# Patient Record
Sex: Female | Born: 1995 | Race: White | Hispanic: No | Marital: Married | Smoking: Never smoker
Health system: Southern US, Community
[De-identification: ages and names within clinical notes are randomized; demographics above are authoritative.]

## PROBLEM LIST (undated history)

## (undated) DIAGNOSIS — Z8669 Personal history of other diseases of the nervous system and sense organs: Secondary | ICD-10-CM

## (undated) DIAGNOSIS — Z789 Other specified health status: Secondary | ICD-10-CM

## (undated) DIAGNOSIS — R002 Palpitations: Secondary | ICD-10-CM

## (undated) DIAGNOSIS — L509 Urticaria, unspecified: Secondary | ICD-10-CM

## (undated) HISTORY — PX: APPENDECTOMY: SHX54

## (undated) HISTORY — DX: Palpitations: R00.2

## (undated) HISTORY — DX: Urticaria, unspecified: L50.9

## (undated) HISTORY — DX: Personal history of other diseases of the nervous system and sense organs: Z86.69

---
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
## 2016-09-15 DIAGNOSIS — L239 Allergic contact dermatitis, unspecified cause: Secondary | ICD-10-CM | POA: Insufficient documentation

## 2017-05-19 NOTE — L&D Delivery Note (Signed)
Delivery Note At 2:51 PM a viable and healthy female was delivered via Vaginal, Spontaneous (Presentation: left occput; anterior ).  APGAR: 9, 9; weight pending .   Placenta status: spontaneous, intact.  Cord: 3V   With delivery of the placenta uterine atony was noted. This resolved with bimanual massage and IM methergine. qBL estimated EBL to be 941, clinical estimate is 500cc  Anesthesia:  Epidural Episiotomy: None Lacerations: 2nd degree Suture Repair: 3.0 vicryl Est. Blood Loss (mL):  941 cc  Mom to postpartum.  Baby to Couplet care / Skin to Skin.  Waynard Reeds 03/18/2018, 3:28 PM

## 2017-07-09 DIAGNOSIS — R300 Dysuria: Secondary | ICD-10-CM | POA: Insufficient documentation

## 2017-07-20 DIAGNOSIS — J029 Acute pharyngitis, unspecified: Secondary | ICD-10-CM | POA: Insufficient documentation

## 2017-10-21 ENCOUNTER — Other Ambulatory Visit (HOSPITAL_COMMUNITY): Payer: Self-pay | Admitting: Obstetrics and Gynecology

## 2017-10-21 DIAGNOSIS — O358XX0 Maternal care for other (suspected) fetal abnormality and damage, not applicable or unspecified: Secondary | ICD-10-CM

## 2017-10-21 DIAGNOSIS — Z3A20 20 weeks gestation of pregnancy: Secondary | ICD-10-CM

## 2017-10-21 DIAGNOSIS — Z3689 Encounter for other specified antenatal screening: Secondary | ICD-10-CM

## 2017-10-21 DIAGNOSIS — O35EXX0 Maternal care for other (suspected) fetal abnormality and damage, fetal genitourinary anomalies, not applicable or unspecified: Secondary | ICD-10-CM

## 2017-11-04 ENCOUNTER — Encounter (HOSPITAL_COMMUNITY): Payer: Self-pay | Admitting: *Deleted

## 2017-11-05 ENCOUNTER — Encounter (HOSPITAL_COMMUNITY): Payer: Self-pay

## 2017-11-05 ENCOUNTER — Ambulatory Visit (HOSPITAL_COMMUNITY)
Admission: RE | Admit: 2017-11-05 | Discharge: 2017-11-05 | Disposition: A | Discharge status: Home / Self Care | Payer: Commercial Managed Care - PPO | Source: Ambulatory Visit | Attending: Obstetrics and Gynecology | Admitting: Obstetrics and Gynecology

## 2017-11-05 ENCOUNTER — Other Ambulatory Visit: Payer: Self-pay

## 2017-11-05 DIAGNOSIS — Z363 Encounter for antenatal screening for malformations: Secondary | ICD-10-CM | POA: Insufficient documentation

## 2017-11-05 DIAGNOSIS — O359XX0 Maternal care for (suspected) fetal abnormality and damage, unspecified, not applicable or unspecified: Secondary | ICD-10-CM | POA: Insufficient documentation

## 2017-11-05 DIAGNOSIS — Z3A21 21 weeks gestation of pregnancy: Secondary | ICD-10-CM | POA: Insufficient documentation

## 2017-11-05 DIAGNOSIS — O99212 Obesity complicating pregnancy, second trimester: Secondary | ICD-10-CM | POA: Insufficient documentation

## 2017-11-05 DIAGNOSIS — O35EXX0 Maternal care for other (suspected) fetal abnormality and damage, fetal genitourinary anomalies, not applicable or unspecified: Secondary | ICD-10-CM

## 2017-11-05 DIAGNOSIS — Z3A20 20 weeks gestation of pregnancy: Secondary | ICD-10-CM

## 2017-11-05 DIAGNOSIS — O358XX0 Maternal care for other (suspected) fetal abnormality and damage, not applicable or unspecified: Secondary | ICD-10-CM

## 2017-11-05 DIAGNOSIS — Z3689 Encounter for other specified antenatal screening: Secondary | ICD-10-CM

## 2017-11-05 HISTORY — DX: Other specified health status: Z78.9

## 2017-11-12 ENCOUNTER — Telehealth (HOSPITAL_COMMUNITY): Payer: Self-pay | Admitting: MS"

## 2017-11-12 NOTE — Telephone Encounter (Signed)
Called Becky Crawford to discuss her prenatal cell free DNA test results.  Mrs. Becky Crawford had Pan[facility] testing through [facility].  Testing was offered because of ultrasound findings.   The patient was identified by name and DOB.  We reviewed that these are within normal limits, showing a less than 1 in 10,000 risk for trisomies 21, 18 and 13, and monosomy X (Turner syndrome).  In addition, the risk for triploidy and sex chromosome trisomies (47,XXX and 47,XXY) was also low risk. We reviewed that this testing identifies > 99% of pregnancies with trisomy 8221, trisomy 6013, sex chromosome trisomies (47,XXX and 47,XXY), and triploidy. The detection rate for trisomy 18 is 96%.  The detection rate for monosomy X is ~92%.  The false positive rate is <0.1% for all conditions. Testing was also consistent with female fetal sex.  She understands that this testing does not identify all genetic conditions.  All questions were answered to her satisfaction, she was encouraged to call with additional questions or concerns.  Quinn PlowmanKaren Denvil Canning, MS Patent attorneyCertified Genetic Counselor

## 2018-01-13 DIAGNOSIS — K529 Noninfective gastroenteritis and colitis, unspecified: Secondary | ICD-10-CM | POA: Insufficient documentation

## 2018-03-17 ENCOUNTER — Encounter (HOSPITAL_COMMUNITY): Payer: Self-pay

## 2018-03-17 ENCOUNTER — Inpatient Hospital Stay (HOSPITAL_COMMUNITY)
Admission: AD | Admit: 2018-03-17 | Discharge: 2018-03-20 | DRG: 807 | Disposition: A | Discharge status: Home / Self Care | Payer: Commercial Managed Care - PPO | Attending: Obstetrics and Gynecology | Admitting: Obstetrics and Gynecology

## 2018-03-17 DIAGNOSIS — Z3A40 40 weeks gestation of pregnancy: Secondary | ICD-10-CM

## 2018-03-17 DIAGNOSIS — O358XX0 Maternal care for other (suspected) fetal abnormality and damage, not applicable or unspecified: Secondary | ICD-10-CM | POA: Diagnosis present

## 2018-03-17 DIAGNOSIS — O134 Gestational [pregnancy-induced] hypertension without significant proteinuria, complicating childbirth: Principal | ICD-10-CM | POA: Diagnosis present

## 2018-03-17 DIAGNOSIS — O479 False labor, unspecified: Secondary | ICD-10-CM | POA: Diagnosis present

## 2018-03-17 NOTE — MAU Note (Signed)
CTX every 5-6 minutes.  No LOF.  Some VB since having her membranes stripped in the office.  Was 2 cm today.  + FM.

## 2018-03-18 ENCOUNTER — Inpatient Hospital Stay (HOSPITAL_COMMUNITY): Payer: Commercial Managed Care - PPO | Admitting: Anesthesiology

## 2018-03-18 ENCOUNTER — Encounter (HOSPITAL_COMMUNITY): Payer: Self-pay

## 2018-03-18 DIAGNOSIS — R030 Elevated blood-pressure reading, without diagnosis of hypertension: Secondary | ICD-10-CM | POA: Diagnosis present

## 2018-03-18 DIAGNOSIS — O358XX0 Maternal care for other (suspected) fetal abnormality and damage, not applicable or unspecified: Secondary | ICD-10-CM | POA: Diagnosis present

## 2018-03-18 DIAGNOSIS — O133 Gestational [pregnancy-induced] hypertension without significant proteinuria, third trimester: Secondary | ICD-10-CM

## 2018-03-18 DIAGNOSIS — O479 False labor, unspecified: Secondary | ICD-10-CM | POA: Diagnosis present

## 2018-03-18 DIAGNOSIS — O134 Gestational [pregnancy-induced] hypertension without significant proteinuria, complicating childbirth: Secondary | ICD-10-CM | POA: Diagnosis present

## 2018-03-18 DIAGNOSIS — Z3A40 40 weeks gestation of pregnancy: Secondary | ICD-10-CM | POA: Diagnosis not present

## 2018-03-18 LAB — COMPREHENSIVE METABOLIC PANEL
ALK PHOS: 108 U/L (ref 38–126)
ALT: 14 U/L (ref 0–44)
AST: 20 U/L (ref 15–41)
Albumin: 3 g/dL — ABNORMAL LOW (ref 3.5–5.0)
Anion gap: 8 (ref 5–15)
BUN: 6 mg/dL (ref 6–20)
CALCIUM: 9 mg/dL (ref 8.9–10.3)
CO2: 21 mmol/L — AB (ref 22–32)
CREATININE: 0.48 mg/dL (ref 0.44–1.00)
Chloride: 106 mmol/L (ref 98–111)
Glucose, Bld: 82 mg/dL (ref 70–99)
Potassium: 3.6 mmol/L (ref 3.5–5.1)
Sodium: 135 mmol/L (ref 135–145)
Total Bilirubin: 0.9 mg/dL (ref 0.3–1.2)
Total Protein: 6.6 g/dL (ref 6.5–8.1)

## 2018-03-18 LAB — CBC
HCT: 37.3 % (ref 36.0–46.0)
HEMOGLOBIN: 12.4 g/dL (ref 12.0–15.0)
MCH: 28.3 pg (ref 26.0–34.0)
MCHC: 33.2 g/dL (ref 30.0–36.0)
MCV: 85.2 fL (ref 80.0–100.0)
Platelets: 249 10*3/uL (ref 150–400)
RBC: 4.38 MIL/uL (ref 3.87–5.11)
RDW: 14.8 % (ref 11.5–15.5)
WBC: 19.3 10*3/uL — ABNORMAL HIGH (ref 4.0–10.5)

## 2018-03-18 LAB — ABO/RH: ABO/RH(D): B POS

## 2018-03-18 LAB — TYPE AND SCREEN
ABO/RH(D): B POS
ANTIBODY SCREEN: NEGATIVE

## 2018-03-18 LAB — RPR: RPR Ser Ql: NONREACTIVE

## 2018-03-18 LAB — PROTEIN / CREATININE RATIO, URINE: CREATININE, URINE: 39 mg/dL

## 2018-03-18 MED ORDER — FENTANYL 2.5 MCG/ML BUPIVACAINE 1/10 % EPIDURAL INFUSION (WH - ANES)
14.0000 mL/h | INTRAMUSCULAR | Status: DC | PRN
  Administered 2018-03-18: 14 mL/h via EPIDURAL
  Filled 2018-03-18 (×3): qty 100

## 2018-03-18 MED ORDER — METHYLERGONOVINE MALEATE 0.2 MG/ML IJ SOLN
INTRAMUSCULAR | Status: AC
  Filled 2018-03-18: qty 1

## 2018-03-18 MED ORDER — OXYCODONE-ACETAMINOPHEN 5-325 MG PO TABS: 2.0000 | ORAL_TABLET | ORAL | Status: DC | PRN

## 2018-03-18 MED ORDER — PHENYLEPHRINE 40 MCG/ML (10ML) SYRINGE FOR IV PUSH (FOR BLOOD PRESSURE SUPPORT)
80.0000 ug | PREFILLED_SYRINGE | INTRAVENOUS | Status: DC | PRN
  Filled 2018-03-18: qty 5

## 2018-03-18 MED ORDER — LACTATED RINGERS IV SOLN: 500.0000 mL | Freq: Once | INTRAVENOUS | Status: DC

## 2018-03-18 MED ORDER — OXYCODONE-ACETAMINOPHEN 5-325 MG PO TABS
1.0000 | ORAL_TABLET | ORAL | Status: DC | PRN
  Administered 2018-03-18: 1 via ORAL
  Filled 2018-03-18: qty 1

## 2018-03-18 MED ORDER — ONDANSETRON HCL 4 MG/2ML IJ SOLN: 4.0000 mg | INTRAMUSCULAR | Status: DC | PRN

## 2018-03-18 MED ORDER — DIBUCAINE 1 % RE OINT: 1.0000 "application " | TOPICAL_OINTMENT | RECTAL | Status: DC | PRN

## 2018-03-18 MED ORDER — METHYLERGONOVINE MALEATE 0.2 MG/ML IJ SOLN
0.2000 mg | Freq: Once | INTRAMUSCULAR | Status: AC
  Administered 2018-03-18: 0.2 mg via INTRAMUSCULAR

## 2018-03-18 MED ORDER — IBUPROFEN 600 MG PO TABS
600.0000 mg | ORAL_TABLET | Freq: Four times a day (QID) | ORAL | Status: DC
  Administered 2018-03-18 – 2018-03-20 (×8): 600 mg via ORAL
  Filled 2018-03-18 (×8): qty 1

## 2018-03-18 MED ORDER — NALBUPHINE HCL 10 MG/ML IJ SOLN: 5.0000 mg | Freq: Once | INTRAMUSCULAR | Status: DC

## 2018-03-18 MED ORDER — OXYCODONE-ACETAMINOPHEN 5-325 MG PO TABS: 1.0000 | ORAL_TABLET | ORAL | Status: DC | PRN

## 2018-03-18 MED ORDER — PRENATAL MULTIVITAMIN CH
1.0000 | ORAL_TABLET | Freq: Every day | ORAL | Status: DC
  Administered 2018-03-19 – 2018-03-20 (×2): 1 via ORAL
  Filled 2018-03-18 (×2): qty 1

## 2018-03-18 MED ORDER — LIDOCAINE HCL (PF) 1 % IJ SOLN
INTRAMUSCULAR | Status: DC | PRN
  Administered 2018-03-18: 8 mL via EPIDURAL

## 2018-03-18 MED ORDER — MISOPROSTOL 200 MCG PO TABS
ORAL_TABLET | ORAL | Status: AC
  Filled 2018-03-18: qty 4

## 2018-03-18 MED ORDER — SIMETHICONE 80 MG PO CHEW: 80.0000 mg | CHEWABLE_TABLET | ORAL | Status: DC | PRN

## 2018-03-18 MED ORDER — BENZOCAINE-MENTHOL 20-0.5 % EX AERO
1.0000 "application " | INHALATION_SPRAY | CUTANEOUS | Status: DC | PRN
  Filled 2018-03-18: qty 56

## 2018-03-18 MED ORDER — OXYTOCIN 40 UNITS IN LACTATED RINGERS INFUSION - SIMPLE MED: 2.5000 [IU]/h | INTRAVENOUS | Status: DC

## 2018-03-18 MED ORDER — WITCH HAZEL-GLYCERIN EX PADS: 1.0000 "application " | MEDICATED_PAD | CUTANEOUS | Status: DC | PRN

## 2018-03-18 MED ORDER — DIPHENHYDRAMINE HCL 50 MG/ML IJ SOLN: 12.5000 mg | INTRAMUSCULAR | Status: DC | PRN

## 2018-03-18 MED ORDER — FENTANYL CITRATE (PF) 100 MCG/2ML IJ SOLN
50.0000 ug | INTRAMUSCULAR | Status: DC | PRN
  Administered 2018-03-18: 100 ug via INTRAVENOUS
  Administered 2018-03-18 (×3): 50 ug via INTRAVENOUS
  Filled 2018-03-18 (×4): qty 2

## 2018-03-18 MED ORDER — EPHEDRINE 5 MG/ML INJ
10.0000 mg | INTRAVENOUS | Status: DC | PRN
  Filled 2018-03-18: qty 2

## 2018-03-18 MED ORDER — ONDANSETRON HCL 4 MG PO TABS: 4.0000 mg | ORAL_TABLET | ORAL | Status: DC | PRN

## 2018-03-18 MED ORDER — TETANUS-DIPHTH-ACELL PERTUSSIS 5-2.5-18.5 LF-MCG/0.5 IM SUSP: 0.5000 mL | Freq: Once | INTRAMUSCULAR | Status: DC

## 2018-03-18 MED ORDER — OXYTOCIN BOLUS FROM INFUSION: 500.0000 mL | Freq: Once | INTRAVENOUS | Status: DC

## 2018-03-18 MED ORDER — ONDANSETRON HCL 4 MG/2ML IJ SOLN
4.0000 mg | Freq: Four times a day (QID) | INTRAMUSCULAR | Status: DC | PRN
  Administered 2018-03-18: 4 mg via INTRAVENOUS
  Filled 2018-03-18: qty 2

## 2018-03-18 MED ORDER — LIDOCAINE HCL (PF) 1 % IJ SOLN
30.0000 mL | INTRAMUSCULAR | Status: DC | PRN
  Filled 2018-03-18: qty 30

## 2018-03-18 MED ORDER — ACETAMINOPHEN 325 MG PO TABS
650.0000 mg | ORAL_TABLET | ORAL | Status: DC | PRN
  Administered 2018-03-18 – 2018-03-19 (×3): 650 mg via ORAL
  Filled 2018-03-18 (×3): qty 2

## 2018-03-18 MED ORDER — PHENYLEPHRINE 40 MCG/ML (10ML) SYRINGE FOR IV PUSH (FOR BLOOD PRESSURE SUPPORT)
80.0000 ug | PREFILLED_SYRINGE | INTRAVENOUS | Status: DC | PRN
  Filled 2018-03-18: qty 5
  Filled 2018-03-18: qty 10

## 2018-03-18 MED ORDER — COCONUT OIL OIL: 1.0000 "application " | TOPICAL_OIL | Status: DC | PRN

## 2018-03-18 MED ORDER — OXYTOCIN 40 UNITS IN LACTATED RINGERS INFUSION - SIMPLE MED
1.0000 m[IU]/min | INTRAVENOUS | Status: DC
  Administered 2018-03-18: 2 m[IU]/min via INTRAVENOUS
  Filled 2018-03-18: qty 1000

## 2018-03-18 MED ORDER — METHYLERGONOVINE MALEATE 0.2 MG PO TABS: 0.2000 mg | ORAL_TABLET | ORAL | Status: DC | PRN

## 2018-03-18 MED ORDER — METHYLERGONOVINE MALEATE 0.2 MG/ML IJ SOLN: 0.2000 mg | INTRAMUSCULAR | Status: DC | PRN

## 2018-03-18 MED ORDER — ACETAMINOPHEN 325 MG PO TABS
650.0000 mg | ORAL_TABLET | ORAL | Status: DC | PRN
  Administered 2018-03-18: 650 mg via ORAL
  Filled 2018-03-18: qty 2

## 2018-03-18 MED ORDER — LACTATED RINGERS IV SOLN: 500.0000 mL | INTRAVENOUS | Status: DC | PRN

## 2018-03-18 MED ORDER — DIPHENHYDRAMINE HCL 25 MG PO CAPS: 25.0000 mg | ORAL_CAPSULE | Freq: Four times a day (QID) | ORAL | Status: DC | PRN

## 2018-03-18 MED ORDER — LACTATED RINGERS IV SOLN
INTRAVENOUS | Status: DC
  Administered 2018-03-18: 03:00:00 via INTRAVENOUS

## 2018-03-18 MED ORDER — TERBUTALINE SULFATE 1 MG/ML IJ SOLN
0.2500 mg | Freq: Once | INTRAMUSCULAR | Status: DC | PRN
  Filled 2018-03-18: qty 1

## 2018-03-18 MED ORDER — ZOLPIDEM TARTRATE 5 MG PO TABS: 5.0000 mg | ORAL_TABLET | Freq: Every evening | ORAL | Status: DC | PRN

## 2018-03-18 MED ORDER — SOD CITRATE-CITRIC ACID 500-334 MG/5ML PO SOLN: 30.0000 mL | ORAL | Status: DC | PRN

## 2018-03-18 MED ORDER — SENNOSIDES-DOCUSATE SODIUM 8.6-50 MG PO TABS
2.0000 | ORAL_TABLET | ORAL | Status: DC
  Administered 2018-03-19 (×2): 2 via ORAL
  Filled 2018-03-18 (×2): qty 2

## 2018-03-18 NOTE — H&P (Signed)
Becky Crawford is a 22 y.o. female presenting for painful uterine contractions which have increased in frequency and intensity over the past 3 hours.  We have noted two elevated sytolic BPs here.  Preeclampsia labs were normal but there is a bit higher leukocytosis than expected.  . OB History    Gravida  1   Para      Term      Preterm      AB      Living  0     SAB      TAB      Ectopic      Multiple      Live Births             Past Medical History:  Diagnosis Date  . Medical history non-contributory    Past Surgical History:  Procedure Laterality Date  . APPENDECTOMY     Family History: family history is not on file. Social History:  reports that she has never smoked. She has never used smokeless tobacco. She reports that she drank alcohol. She reports that she does not use drugs.     Maternal Diabetes: No Genetic Screening: Normal Maternal Ultrasounds/Referrals: Normal Fetal Ultrasounds or other Referrals:  None Maternal Substance Abuse:  No Significant Maternal Medications:  None Significant Maternal Lab Results:  Lab values include: Group B Strep negative Other Comments:  None  Review of Systems  Constitutional: Negative for chills and fever.  Eyes: Negative for blurred vision.  Gastrointestinal: Positive for abdominal pain. Negative for constipation, diarrhea, nausea and vomiting.  Musculoskeletal: Positive for back pain.  Neurological: Negative for dizziness, focal weakness and headaches.   Maternal Medical History:  Reason for admission: Contractions.  Nausea.  Contractions: Onset was 3-5 hours ago.   Frequency: regular.   Perceived severity is strong.    Fetal activity: Perceived fetal activity is normal.   Last perceived fetal movement was within the past hour.    Prenatal complications: No bleeding, PIH (two elevated sytolic BPs here), placental abnormality, polyhydramnios, pre-eclampsia, preterm labor or substance abuse.    Prenatal Complications - Diabetes: none.    Dilation: 3.5 Effacement (%): 60 Station: -3 Exam by:: [name], RN Blood pressure 117/85, pulse 89, temperature 98.2 F (36.8 C), resp. rate 19, last menstrual period 06/10/2017. Maternal Exam:  Uterine Assessment: Contraction strength is firm.  Contraction frequency is regular.   Abdomen: Patient reports no abdominal tenderness. Estimated fetal weight is 7.5.   Fetal presentation: vertex  Introitus: Normal vulva. Normal vagina.  Ferning test: not done.  Nitrazine test: not done. Amniotic fluid character: not assessed.  Pelvis: adequate for delivery.   Cervix: Cervix evaluated by digital exam.     Fetal Exam Fetal Monitor Review: Mode: ultrasound.   Baseline rate: 140.  Variability: moderate (6-25 bpm).   Pattern: accelerations present and no decelerations.    Fetal State Assessment: Category I - tracings are normal.     Physical Exam  Constitutional: She is oriented to person, place, and time. She appears well-developed and well-nourished. No distress.  HENT:  Head: Normocephalic.  Cardiovascular: Normal rate and regular rhythm.  Respiratory: Effort normal and breath sounds normal. No respiratory distress.  GI: Soft. She exhibits no distension. There is no tenderness. There is no rebound and no guarding.  Genitourinary:  Genitourinary Comments: Dilation: 3.5 Effacement (%): 60 Cervical Position: Middle Station: -3 Presentation: Vertex Exam by:: [name], RN   Musculoskeletal: Normal range of motion. She exhibits edema (  trace).  Neurological: She is alert and oriented to person, place, and time.  Skin: Skin is warm and dry.  Psychiatric: She has a normal mood and affect.    Prenatal labs: ABO, Rh:   Antibody:   Rubella:   RPR:    HBsAg:    HIV:    GBS:     Assessment/Plan: Single intrauterine pregnancy at [redacted]w[redacted]d Latent phase labor Two elevated systolic blood pressure readings No evidence of  preeclampsia  Admit to [facility] suites per Dr [name] Routine orders MD to follow   Wynelle Bourgeois 03/18/2018, 2:44 AM

## 2018-03-18 NOTE — Anesthesia Postprocedure Evaluation (Signed)
Anesthesia Post Note  Patient: Becky Crawford  Procedure(s) Performed: AN AD HOC LABOR EPIDURAL     Patient location during evaluation: Mother Baby Anesthesia Type: Epidural Level of consciousness: awake and alert and oriented Pain management: satisfactory to patient Vital Signs Assessment: post-procedure vital signs reviewed and stable Respiratory status: respiratory function stable and spontaneous breathing Cardiovascular status: blood pressure returned to baseline Postop Assessment: no headache, no backache, spinal receding, patient able to bend at knees and adequate PO intake Anesthetic complications: no    Last Vitals:  Vitals:   03/18/18 1601 03/18/18 1630  BP: 125/80 128/88  Pulse: (!) 107 100  Resp: 16 16  Temp:      Last Pain:  Vitals:   03/18/18 1201  TempSrc:   PainSc: 4    Pain Goal: Patients Stated Pain Goal: 2 (03/18/18 0645)               Karleen Dolphin

## 2018-03-18 NOTE — Anesthesia Procedure Notes (Signed)
Epidural Patient location during procedure: OB Start time: 03/18/2018 8:30 AM End time: 03/18/2018 8:35 AM  Staffing Anesthesiologist: Bethena Midget, MD  Preanesthetic Checklist Completed: patient identified, site marked, surgical consent, pre-op evaluation, timeout performed, IV checked, risks and benefits discussed and monitors and equipment checked  Epidural Patient position: sitting Prep: site prepped and draped and DuraPrep Patient monitoring: continuous pulse ox and blood pressure Approach: midline Location: L4-L5 Injection technique: LOR air  Needle:  Needle type: Tuohy  Needle gauge: 17 G Needle length: 9 cm and 9 Needle insertion depth: 6 cm Catheter type: closed end flexible Catheter size: 19 Gauge Catheter at skin depth: 11 cm Test dose: negative  Assessment Events: blood not aspirated, injection not painful, no injection resistance, negative IV test and no paresthesia

## 2018-03-18 NOTE — MAU Provider Note (Signed)
Chief Complaint:  Contractions  Becky Crawford   First Provider Initiated Contact with Patient 03/18/18 [phone]     HPI: Becky Crawford is a 22 y.o. G1P0 at 67w1dwho presents to maternity admissions reporting one elevated blood pressure while here for labor evaluation. . She reports good fetal movement, denies LOF, vaginal bleeding, vaginal itching/burning, urinary symptoms, h/a, dizziness, n/v, diarrhea, constipation or fever/chills.  She denies headache, visual changes or RUQ abdominal pain.  Abdominal Pain  This is a new problem. The current episode started today. The onset quality is gradual. The problem has been gradually worsening. The quality of the pain is cramping. The abdominal pain does not radiate. Pertinent negatives include no constipation, diarrhea, dysuria, fever, headaches, myalgias, nausea or vomiting. Nothing aggravates the pain. The pain is relieved by nothing. She has tried nothing for the symptoms.   Past Medical History: Past Medical History:  Diagnosis Date  . Medical history non-contributory     Past obstetric history: OB History  Gravida Para Term Preterm AB Living  1         0  SAB TAB Ectopic Multiple Live Births               # Outcome Date GA Lbr Len/2nd Weight Sex Delivery Anes PTL Lv  1 Current             Past Surgical History: Past Surgical History:  Procedure Laterality Date  . APPENDECTOMY      Family History: No family history on file.  Social History: Social History   Tobacco Use  . Smoking status: Never Smoker  . Smokeless tobacco: Never Used  Substance Use Topics  . Alcohol use: Not Currently  . Drug use: Never    Allergies:  Allergies  Allergen Reactions  . Amoxicillin Rash    High fever, reaction as a child    Meds:  Medications Prior to Admission  Medication Sig Dispense Refill Last Dose  . Prenatal Vit w/Fe-Methylfol-FA (PNV PO) Take by mouth.   03/17/2018 at Unknown time    I have reviewed patient's Past Medical Hx,  Surgical Hx, Family Hx, Social Hx, medications and allergies.   ROS:  Review of Systems  Constitutional: Negative for fever.  Gastrointestinal: Positive for abdominal pain. Negative for constipation, diarrhea, nausea and vomiting.  Genitourinary: Negative for dysuria.  Musculoskeletal: Negative for myalgias.  Neurological: Negative for headaches.   Other systems negative  Physical Exam   Patient Vitals for the past 24 hrs:  BP Temp Pulse Resp  03/18/18 0017 130/60 - 94 -  03/18/18 0001 135/85 - 90 -  03/17/18 2346 136/77 - 90 -  03/17/18 2334 (!) 144/77 98.2 F (36.8 C) (!) 106 19   Constitutional: Well-developed, well-nourished female in no acute distress.  Cardiovascular: normal rate and rhythm Respiratory: normal effort, clear to auscultation bilaterally GI: Abd soft, non-tender, gravid appropriate for gestational age.   No rebound or guarding. MS: Extremities nontender, TR edema, normal ROM   DTRs 3+, no clonus Neurologic: Alert and oriented x 4.  GU: Neg CVAT.  PELVIC EXAM: Dilation: 3 Effacement (%): 60 Cervical Position: Middle Station: -3 Presentation: Vertex Exam by:: [name], RN Dilation: 3.5 Effacement (%): 60 Cervical Position: Middle Station: -3 Presentation: Vertex Exam by:: [name], RN  FHT:  Baseline 140 , moderate variability, accelerations present, no decelerations Contractions: q 2-3 mins Irregular     Labs: Results for orders placed or performed during the hospital encounter of 03/17/18 (from the past  24 hour(s))  Protein / creatinine ratio, urine     Status: None   Collection Time: 03/18/18 12:33 AM  Result Value Ref Range   Creatinine, Urine 39.00 mg/dL   Total Protein, Urine <6.00 mg/dL   Protein Creatinine Ratio        0.00 - 0.15 mg/mg[Cre]  CBC     Status: Abnormal   Collection Time: 03/18/18 12:51 AM  Result Value Ref Range   WBC 19.3 (H) 4.0 - 10.5 K/uL   RBC 4.38 3.87 - 5.11 MIL/uL   Hemoglobin 12.4 12.0 - 15.0 g/dL   HCT  96.0 45.4 - 09.8 %   MCV 85.2 80.0 - 100.0 fL   MCH 28.3 26.0 - 34.0 pg   MCHC 33.2 30.0 - 36.0 g/dL   RDW 11.9 14.7 - 82.9 %   Platelets 249 150 - 400 K/uL  Comprehensive metabolic panel     Status: Abnormal   Collection Time: 03/18/18 12:51 AM  Result Value Ref Range   Sodium 135 135 - 145 mmol/L   Potassium 3.6 3.5 - 5.1 mmol/L   Chloride 106 98 - 111 mmol/L   CO2 21 (L) 22 - 32 mmol/L   Glucose, Bld 82 70 - 99 mg/dL   BUN 6 6 - 20 mg/dL   Creatinine, Ser 5.62 0.44 - 1.00 mg/dL   Calcium 9.0 8.9 - 13.0 mg/dL   Total Protein 6.6 6.5 - 8.1 g/dL   Albumin 3.0 (L) 3.5 - 5.0 g/dL   AST 20 15 - 41 U/L   ALT 14 0 - 44 U/L   Alkaline Phosphatase 108 38 - 126 U/L   Total Bilirubin 0.9 0.3 - 1.2 mg/dL   GFR calc non Af Amer >60 >60 mL/min   GFR calc Af Amer >60 >60 mL/min   Anion gap 8 5 - 15       Imaging:  No results found.  MAU Course/MDM: I have ordered labs and reviewed results. Preeclampsia labs sent and are normal   There is some leukocytosis, more than I would expect in laboring patient NST reviewed, reactive Consult Dr Jolayne Panther and Dr Becky Crawford with presentation, exam findings and test results. They both agree we should admit her due to two elevated SBPs and persistent labor Treatments in MAU included EFM Declined pain med.    Assessment: Single intrauterine pregnancy at [redacted]w[redacted]d Uterine contractions, latent labor Possible gestational hypertension without evidence of preeclampsia  Plan: Admit to [facility] suites Routine orders MD to follow  Wynelle Bourgeois CNM, MSN Certified Nurse-Midwife 03/18/2018 12:28 AM

## 2018-03-18 NOTE — H&P (Signed)
22 y.o. [redacted]w[redacted]d  G1P0 comes in c/o painful contractions.  Otherwise has good fetal movement and no bleeding.  Past Medical History:  Diagnosis Date  . Medical history non-contributory     Past Surgical History:  Procedure Laterality Date  . APPENDECTOMY      OB History  Gravida Para Term Preterm AB Living  1         0  SAB TAB Ectopic Multiple Live Births               # Outcome Date GA Lbr Len/2nd Weight Sex Delivery Anes PTL Lv  1 Current             Social History   Socioeconomic History  . Marital status: Single    Spouse name: Not on file  . Number of children: Not on file  . Years of education: Not on file  . Highest education level: Not on file  Occupational History  . Not on file  Social Needs  . Financial resource strain: Not on file  . Food insecurity:    Worry: Not on file    Inability: Not on file  . Transportation needs:    Medical: Not on file    Non-medical: Not on file  Tobacco Use  . Smoking status: Never Smoker  . Smokeless tobacco: Never Used  Substance and Sexual Activity  . Alcohol use: Not Currently  . Drug use: Never  . Sexual activity: Yes    Birth control/protection: None  Lifestyle  . Physical activity:    Days per week: Not on file    Minutes per session: Not on file  . Stress: Not on file  Relationships  . Social connections:    Talks on phone: Not on file    Gets together: Not on file    Attends religious service: Not on file    Active member of club or organization: Not on file    Attends meetings of clubs or organizations: Not on file    Relationship status: Not on file  . Intimate partner violence:    Fear of current or ex partner: Not on file    Emotionally abused: Not on file    Physically abused: Not on file    Forced sexual activity: Not on file  Other Topics Concern  . Not on file  Social History Narrative  . Not on file   Amoxicillin    Prenatal Transfer Tool  Maternal Diabetes: No Genetic Screening: Normal  (1:410 DS risk) Maternal Ultrasounds/Referrals: Abnormal:  Findings:   Isolated choroid plexus cyst, Fetal renal pyelectasis Fetal Ultrasounds or other Referrals:  Referred to Materal Fetal Medicine  Maternal Substance Abuse:  No Significant Maternal Medications:  None Significant Maternal Lab Results: Lab values include: Group B Strep negative  Other PNC: uncomplicated.    Vitals:   03/18/18 0316 03/18/18 0326 03/18/18 0433 03/18/18 0440  BP:  120/72 132/83 132/73  Pulse:  97 92 97  Resp:   20 18  Temp:  97.9 F (36.6 C)    TempSrc:  Oral    Weight: 112.4 kg     Height: 5\' 10"  (1.778 m)       Lungs/Cor:  NAD Abdomen:  soft, gravid Ex:  no cords, erythema SVE:  3/60/-3 at admission FHTs:  125, good STV, NST R; Cat 1 tracing. Toco:  q 4   A/P   Admitted for early labor at 40.1 with elevated blood pressure  GBS Neg  Routine  care  [place], [place]

## 2018-03-18 NOTE — Anesthesia Preprocedure Evaluation (Signed)

## 2018-03-18 NOTE — Anesthesia Pain Management Evaluation Note (Signed)
  CRNA Pain Management Visit Note  Patient: Becky Crawford, 22 y.o., female  "Hello I am a member of the anesthesia team at [facility]. We have an anesthesia team available at all times to provide care throughout the hospital, including epidural management and anesthesia for C-section. I don't know your plan for the delivery whether it a natural birth, water birth, IV sedation, nitrous supplementation, doula or epidural, but we want to meet your pain goals."   1.Was your pain managed to your expectations on prior hospitalizations?   No prior hospitalizations  2.What is your expectation for pain management during this hospitalization?     Epidural and IV pain meds  3.How can we help you reach that goal? IV pain meds given, requesting epidural  Record the patient's initial score and the patient's pain goal.   Pain: 10  Pain Goal: 5 [facility] wants you to be able to say your pain was always managed very well.  [facility] 03/18/2018

## 2018-03-19 LAB — CBC
HEMATOCRIT: 29.1 % — AB (ref 36.0–46.0)
Hemoglobin: 9.5 g/dL — ABNORMAL LOW (ref 12.0–15.0)
MCH: 28.7 pg (ref 26.0–34.0)
MCHC: 32.6 g/dL (ref 30.0–36.0)
MCV: 87.9 fL (ref 80.0–100.0)
NRBC: 0 % (ref 0.0–0.2)
PLATELETS: 201 10*3/uL (ref 150–400)
RBC: 3.31 MIL/uL — AB (ref 3.87–5.11)
RDW: 14.6 % (ref 11.5–15.5)
WBC: 20.2 10*3/uL — ABNORMAL HIGH (ref 4.0–10.5)

## 2018-03-19 NOTE — Lactation Note (Addendum)
This note was copied from a baby's chart. Lactation Consultation Note  Patient Name: Becky Crawford [id number] Date: 03/19/2018 Reason for consult: Initial assessment;1st time breastfeeding;Term P1, 12 hour female infant. Per mom, using hand pump pre-pump due flat nipples. Per mom infant had 2 wet and one soiled diaper in past 11 hours of life. Mom was given a harmony hand pump by nurse to pre-pump breast to help evert nipple out more due flat nipples. LC as mom do a nipple roll and hand express prior to latching infant to breast. Infant was give 1.5 ml of colostrum on spoon by LC. Mom BF infant on left breast using cross cradle hold and  infant had deep latch with audible swallows observed by LC. Infant BF for 8 minutes. Mom will BF according hunger cues 8 to 12 times within 24 hours including nights. LC discussed I &O. Reviewed Baby & Me book's Breastfeeding Basics.  Mom made aware of O/P services, breastfeeding support groups, community resources, and our phone # for post-discharge questions.  Maternal Data Formula Feeding for Exclusion: No Has patient been taught Hand Expression?: Yes(Mom hand express 1.5 ml of colostrum that was spoon fed to infant.) Does the patient have breastfeeding experience prior to this delivery?: No  Feeding    LATCH Score Latch: Grasps breast easily, tongue down, lips flanged, rhythmical sucking.  Audible Swallowing: Spontaneous and intermittent  Type of Nipple: Flat  Comfort (Breast/Nipple): Soft / non-tender  Hold (Positioning): Assistance needed to correctly position infant at breast and maintain latch.  LATCH Score: 8  Interventions Interventions: Breast feeding basics reviewed;Assisted with latch;Skin to skin;Pre-pump if needed;Hand express;Support pillows;Adjust position;Breast compression;Hand pump  Lactation Tools Discussed/Used WIC Program: No(Per mom, has medela DEBP at home.)   Consult Status Consult Status: Follow-up Date:  03/19/18 Follow-up type: In-patient    Danelle Earthly 03/19/2018, 3:23 AM

## 2018-03-20 MED ORDER — IBUPROFEN 600 MG PO TABS: 600.0000 mg | ORAL_TABLET | Freq: Four times a day (QID) | ORAL | 0 refills | Status: DC

## 2018-03-20 NOTE — Lactation Note (Signed)
This note was copied from a baby's chart. Lactation Consultation Note  Patient Name: Becky Crawford [id number] Date: 03/20/2018 Reason for consult: Follow-up assessment   P1, Baby 43 hours old.  Mother states breastfeeding is going well. Per mother baby cluster fed last night and her breasts feel fuller today.  Mother has a blister on her L nipple. For soreness suggest mother apply ebm while wearing shells and alternate with comfort gels. Mom encouraged to feed baby 8-12 times/24 hours and with feeding cues.  Reviewed engorgement care and monitoring voids/stools. Reminded mother about support group.    Maternal Data    Feeding Feeding Type: Breast Fed  LATCH Score Latch: Repeated attempts needed to sustain latch, nipple held in mouth throughout feeding, stimulation needed to elicit sucking reflex.  Audible Swallowing: A few with stimulation  Type of Nipple: Flat  Comfort (Breast/Nipple): Filling, red/small blisters or bruises, mild/mod discomfort  Hold (Positioning): Assistance needed to correctly position infant at breast and maintain latch.  LATCH Score: 5  Interventions Interventions: Breast feeding basics reviewed;Shells;Comfort gels;Hand pump  Lactation Tools Discussed/Used     Consult Status Consult Status: Complete Date: 03/20/18    Becky Crawford [facility] 03/20/2018, 10:23 AM

## 2018-03-20 NOTE — Discharge Summary (Signed)
Obstetric Discharge Summary Reason for Admission: onset of labor and and gestational hypertension Prenatal Procedures: none Intrapartum Procedures: spontaneous vaginal delivery Postpartum Procedures: none Complications-Operative and Postpartum: 2nd degree perineal laceration and uterine atony at delivery that resolved with bimanual massage and methergine Hemoglobin  Date Value Ref Range Status  03/19/2018 9.5 (L) 12.0 - 15.0 g/dL Final    Comment:    DELTA CHECK NOTED REPEATED TO VERIFY    HCT  Date Value Ref Range Status  03/19/2018 29.1 (L) 36.0 - 46.0 % Final    Physical Exam:  General: alert, cooperative and appears stated age 53: appropriate Uterine Fundus: firm DVT Evaluation: No evidence of DVT seen on physical exam.  Discharge Diagnoses: Term Pregnancy-delivered  Discharge Information: Date: 03/20/2018 Activity: pelvic rest Diet: routine Medications: PNV and Ibuprofen Condition: stable Instructions: refer to practice specific booklet Discharge to: home Follow-up Information    Waynard Reeds, MD Follow up in 4 day(s).   Specialty:  Obstetrics and Gynecology Why:  f/u in office in 3-5 days for BP check with on-call physician f/u with Dr. Tenny Craw in 4 weeks for routine postpartum visit Contact information: [address] [place] [address] [phone]           Newborn Data: Live born female  Birth Weight: 7 lb 10.2 oz (3465 g) APGAR: 9, 9  Newborn Delivery   Birth date/time:  03/18/2018 14:51:00 Delivery type:  Vaginal, Spontaneous     Home with mother.  Becky Crawford Becky Crawford 03/20/2018, 11:44 AM

## 2018-03-20 NOTE — Progress Notes (Signed)
Patient is doing well.  She is ambulating, voiding, tolerating PO.  Pain control is good.  Lochia is appropriate Patient had rare 140 systolic BP in early labor.  Following delivery, BPs normal.  One 141/75 this AM.  Is asymptomatic (no HA/RUQ pain / BV)  Vitals:   03/19/18 2300 03/20/18 0500 03/20/18 0908 03/20/18 1112  BP: (!) 133/59 (!) 141/75 112/88 127/82  Pulse: 66 69 68 68  Resp: 16  18   Temp: 98 F (36.7 C) (!) 97.5 F (36.4 C)    TempSrc: Oral Oral    SpO2: 99%     Weight:      Height:        NAD Fundus firm Ext: 1+ edema b/l  Lab Results  Component Value Date   WBC 20.2 (H) 03/19/2018   HGB 9.5 (L) 03/19/2018   HCT 29.1 (L) 03/19/2018   MCV 87.9 03/19/2018   PLT 201 03/19/2018    --/--/B POS, B POS Performed at [facility], [address], [place], [place] [address]  (10/31 0051)/[employer]  A/P 22 y.o. G1P1001 PPD#2 s/p TVSD. Routine care.   Likely gestational hypertension, one mildly elevated BP this AM.  Last two BPs 110-120s/80s.  Will monitor through early afternoon.  If continue to be normal, will d/c to home with short interval f/u in office for BP check  Lilliana Turner GEFFEL Iyani Dresner

## 2018-03-20 NOTE — Discharge Instructions (Signed)
Pelvic rest x 6 weeks (no intercourse or tampons).    Call your doctor if you have heavy vaginal bleeding (soaking through > 1 pad an hour for more for >2 hours in a row), temperature >101F, severe nausea, vomiting, severe or worsening abdominal pain, dizziness, shortness of breath, chest pain, severe headache, right upper quadrant pain, visual changes or any other concerns.  Please take motrin every 6 hours.  Add tylenol if your pain is not controlled by motrin alone.

## 2019-06-01 ENCOUNTER — Ambulatory Visit (INDEPENDENT_AMBULATORY_CARE_PROVIDER_SITE_OTHER): Payer: Commercial Managed Care - PPO | Admitting: Allergy and Immunology

## 2019-06-01 ENCOUNTER — Other Ambulatory Visit: Payer: Self-pay

## 2019-06-01 ENCOUNTER — Encounter: Payer: Self-pay | Admitting: Allergy and Immunology

## 2019-06-01 VITALS — BP 104/70 | HR 92 | Temp 98.5°F | Resp 20 | Ht 70.0 in | Wt 245.8 lb

## 2019-06-01 DIAGNOSIS — J3089 Other allergic rhinitis: Secondary | ICD-10-CM | POA: Diagnosis not present

## 2019-06-01 DIAGNOSIS — K2970 Gastritis, unspecified, without bleeding: Secondary | ICD-10-CM

## 2019-06-01 DIAGNOSIS — T7840XA Allergy, unspecified, initial encounter: Secondary | ICD-10-CM | POA: Insufficient documentation

## 2019-06-01 DIAGNOSIS — H1013 Acute atopic conjunctivitis, bilateral: Secondary | ICD-10-CM

## 2019-06-01 DIAGNOSIS — L500 Allergic urticaria: Secondary | ICD-10-CM | POA: Insufficient documentation

## 2019-06-01 DIAGNOSIS — T783XXD Angioneurotic edema, subsequent encounter: Secondary | ICD-10-CM | POA: Diagnosis not present

## 2019-06-01 DIAGNOSIS — H1010 Acute atopic conjunctivitis, unspecified eye: Secondary | ICD-10-CM | POA: Insufficient documentation

## 2019-06-01 DIAGNOSIS — T783XXA Angioneurotic edema, initial encounter: Secondary | ICD-10-CM | POA: Insufficient documentation

## 2019-06-01 DIAGNOSIS — T7840XD Allergy, unspecified, subsequent encounter: Secondary | ICD-10-CM

## 2019-06-01 MED ORDER — MONTELUKAST SODIUM 10 MG PO TABS: ORAL_TABLET | ORAL | 5 refills | Status: DC

## 2019-06-01 MED ORDER — AZELASTINE HCL 0.1 % NA SOLN: NASAL | 5 refills | Status: DC

## 2019-06-01 MED ORDER — LEVOCETIRIZINE DIHYDROCHLORIDE 5 MG PO TABS: ORAL_TABLET | ORAL | 5 refills | Status: DC

## 2019-06-01 MED ORDER — FAMOTIDINE 20 MG PO TABS: ORAL_TABLET | ORAL | 5 refills | Status: DC

## 2019-06-01 MED ORDER — OLOPATADINE HCL 0.2 % OP SOLN: OPHTHALMIC | 5 refills | Status: AC

## 2019-06-01 NOTE — Assessment & Plan Note (Addendum)
Unclear etiology. Often times the onset of urticaria is secondary to viral infection. Once the mast cell membranes have been destabilized, it is not unusual for recurrent episodes of histamine release to occur in the ensuing weeks or months.  Skin tests to select food allergens were negative to all foods with the exception of lobster.  However, Becky Crawford consumes crustaceans on a regular basis without symptoms, therefore this most likely represents a false positive result. NSAIDs and emotional stress commonly exacerbate urticaria but are not the underlying etiology in this case. Physical urticarias are negative by history (i.e. pressure-induced, temperature, vibration, solar, etc.). History and lesions are not consistent with urticaria pigmentosa so I am not suspicious for mastocytosis. There are no concomitant symptoms concerning for anaphylaxis or constitutional symptoms worrisome for an underlying malignancy. We will rule out other potential etiologies with labs. For symptom relief, patient is to take oral antihistamines as directed.  As she has experienced the sensation of throat tightness, she will continue to have access to epinephrine autoinjectors.  The following labs have been ordered: FCeRI antibody, anti-thyroglobulin antibody, thyroid peroxidase antibody, tryptase, H. pylori serology, CBC, CMP, ESR, ANA, and alpha-gal panel.  The patient will be called with further recommendations after lab results have returned.  Instructions have been discussed and provided for H1/H2 receptor blockade with titration to find lowest effective dose.  A prescription has been provided for levocetirizine (Xyzal), 5 mg daily as needed.  A prescription has been provided for famotidine (Pepcid), 21m twice daily as needed.  A prescription has been provided for montelukast 10 mg daily at bedtime.  Potential montelukast side effects have been discussed and the patient has verbalized understanding.  Should there be a  significant increase or change in symptoms, a journal is to be kept recording any foods eaten, beverages consumed, medications taken within a 6 hour period prior to the onset of symptoms, as well as record activities being performed, and environmental conditions. For any symptoms concerning for anaphylaxis, epinephrine is to be administered and 911 is to be called immediately.

## 2019-06-01 NOTE — Patient Instructions (Addendum)
Recurrent urticaria Unclear etiology. Often times the onset of urticaria is secondary to viral infection. Once the mast cell membranes have been destabilized, it is not unusual for recurrent episodes of histamine release to occur in the ensuing weeks or months.  Skin tests to select food allergens were negative to all foods with the exception of lobster.  However, Diann consumes crustaceans on a regular basis without symptoms, therefore this most likely represents a false positive result. NSAIDs and emotional stress commonly exacerbate urticaria but are not the underlying etiology in this case. Physical urticarias are negative by history (i.e. pressure-induced, temperature, vibration, solar, etc.). History and lesions are not consistent with urticaria pigmentosa so I am not suspicious for mastocytosis. There are no concomitant symptoms concerning for anaphylaxis or constitutional symptoms worrisome for an underlying malignancy. We will rule out other potential etiologies with labs. For symptom relief, patient is to take oral antihistamines as directed.  As she has experienced the sensation of throat tightness, she will continue to have access to epinephrine autoinjectors.  The following labs have been ordered: FCeRI antibody, anti-thyroglobulin antibody, thyroid peroxidase antibody, tryptase, H. pylori serology, CBC, CMP, ESR, ANA, and alpha-gal panel.  The patient will be called with further recommendations after lab results have returned.  Instructions have been discussed and provided for H1/H2 receptor blockade with titration to find lowest effective dose.  A prescription has been provided for levocetirizine (Xyzal), 5 mg daily as needed.  A prescription has been provided for famotidine (Pepcid), 85m twice daily as needed.  A prescription has been provided for montelukast 10 mg daily at bedtime.  Potential montelukast side effects have been discussed and the patient has verbalized  understanding.  Should there be a significant increase or change in symptoms, a journal is to be kept recording any foods eaten, beverages consumed, medications taken within a 6 hour period prior to the onset of symptoms, as well as record activities being performed, and environmental conditions. For any symptoms concerning for anaphylaxis, epinephrine is to be administered and 911 is to be called immediately.  Angioedema Associated angioedema occurs in up to 50% of patients with chronic urticaria.  Treatment/diagnostic plan as outlined above.  Perennial allergic rhinitis  Aeroallergen avoidance measures have been discussed and provided in written form.  Levocetirizine has been prescribed (as above).  Montelukast has been prescribed (as above).  A prescription has been provided for azelastine nasal spray, 1-2 sprays per nostril 2 times daily as needed. Proper nasal spray technique has been discussed and demonstrated.   Nasal saline spray (i.e., Simply Saline) or nasal saline lavage (i.e., NeilMed) is recommended as needed and prior to medicated nasal sprays.  Allergic conjunctivitis  Treatment plan as outlined above for allergic rhinitis.  A prescription has been provided for Pataday, one drop per eye daily as needed.  I have also recommended eye lubricant drops (i.e., Natural Tears) as needed.   When lab results have returned you will be called with further recommendations. With the newly implemented Cures Act, the labs may be visible to you at the same time they become visible to [place] However, the results will typically not be addressed until all of the results are back, so please be patient.  Until you have heard from [place] please continue the treatment plan as outlined on your take home sheet.  Urticaria (Hives)  . Levocetirizine (Xyzal) 5 mg twice a day and famotidine (Pepcid) 20 mg twice a day. If no symptoms for 7-14 days then decrease to. .Marland Kitchen  Levocetirizine (Xyzal) 5 mg twice  a day and famotidine (Pepcid) 20 mg once a day.  If no symptoms for 7-14 days then decrease to. . Levocetirizine (Xyzal) 5 mg twice a day.  If no symptoms for 7-14 days then decrease to. . Levocetirizine (Xyzal) 5 mg once a day.  May use Benadryl (diphenhydramine) as needed for breakthrough symptoms       If symptoms return, then step up dosage   Control of Century dust mites play a major role in allergic asthma and rhinitis.  They occur in environments with high humidity wherever human skin, the food for dust mites is found. High levels have been detected in dust obtained from mattresses, pillows, carpets, upholstered furniture, bed covers, clothes and soft toys.  The principal allergen of the house dust mite is found in its feces.  A gram of dust may contain 1,000 mites and 250,000 fecal particles.  Mite antigen is easily measured in the air during house cleaning activities.    1. Encase mattresses, including the box spring, and pillow, in an air tight cover.  Seal the zipper end of the encased mattresses with wide adhesive tape. 2. Wash the bedding in water of 130 degrees Farenheit weekly.  Avoid cotton comforters/quilts and flannel bedding: the most ideal bed covering is the dacron comforter. 3. Remove all upholstered furniture from the bedroom. 4. Remove carpets, carpet padding, rugs, and non-washable window drapes from the bedroom.  Wash drapes weekly or use plastic window coverings. 5. Remove all non-washable stuffed toys from the bedroom.  Wash stuffed toys weekly. 6. Have the room cleaned frequently with a vacuum cleaner and a damp dust-mop.  The patient should not be in a room which is being cleaned and should wait 1 hour after cleaning before going into the room. 7. Close and seal all heating outlets in the bedroom.  Otherwise, the room will become filled with dust-laden air.  An electric heater can be used to heat the room. 8. Reduce indoor humidity to less  than 50%.  Do not use a humidifier.  Control of Mold Allergen  Mold and fungi can grow on a variety of surfaces provided certain temperature and moisture conditions exist.  Outdoor molds grow on plants, decaying vegetation and soil.  The major outdoor mold, Alternaria and Cladosporium, are found in very high numbers during hot and dry conditions.  Generally, a late Summer - Fall peak is seen for common outdoor fungal spores.  Rain will temporarily lower outdoor mold spore count, but counts rise rapidly when the rainy period ends.  The most important indoor molds are Aspergillus and Penicillium.  Dark, humid and poorly ventilated basements are ideal sites for mold growth.  The next most common sites of mold growth are the bathroom and the kitchen.  Outdoor [employer] 1. Use air conditioning and keep windows closed 2. Avoid exposure to decaying vegetation. 3. Avoid leaf raking. 4. Avoid grain handling. 5. Consider wearing a face mask if working in moldy areas.  Indoor Mold Control 1. Maintain humidity below 50%. 2. Clean washable surfaces with 5% bleach solution. 3. Remove sources e.g. Contaminated carpets.

## 2019-06-01 NOTE — Assessment & Plan Note (Signed)
   Treatment plan as outlined above for allergic rhinitis.  A prescription has been provided for Pataday, one drop per eye daily as needed.  I have also recommended eye lubricant drops (i.e., Natural Tears) as needed. 

## 2019-06-01 NOTE — Assessment & Plan Note (Addendum)
   Aeroallergen avoidance measures have been discussed and provided in written form.  Levocetirizine has been prescribed (as above).  Montelukast has been prescribed (as above).  A prescription has been provided for azelastine nasal spray, 1-2 sprays per nostril 2 times daily as needed. Proper nasal spray technique has been discussed and demonstrated.   Nasal saline spray (i.e., Simply Saline) or nasal saline lavage (i.e., NeilMed) is recommended as needed and prior to medicated nasal sprays.

## 2019-06-01 NOTE — Assessment & Plan Note (Signed)
Associated angioedema occurs in up to 50% of patients with chronic urticaria.  Treatment/diagnostic plan as outlined above. 

## 2019-06-01 NOTE — Progress Notes (Signed)
New Patient Note  RE: Becky Crawford MRN: [id number] DOB: 1995-12-12 Date of Office Visit: 06/01/2019  Referring provider: Maggie Schwalbe, PA-C Primary care provider: Maggie Schwalbe, PA-C  Chief Complaint: Angioedema and Urticaria  History of present illness: Becky Crawford is a 24 y.o. female seen today in consultation requested by Agnes Lawrence, Azalee Course.  Since November 23rd, Becky Crawford has experienced recurrent episodes of hives. The hives have appeared at different times over her entire body.  The lesions are described as erythematous, raised, and pruritic.  Individual hives last less than 24 hours without leaving residual pigmentation or bruising. She has experienced associated angioedema of the lips on 2 occasions as well as perceived difficulty swallowing. She has not experienced unexpected weight loss, recurrent fevers or drenching night sweats. Initially thought that venison was the trigger but removed venison from diet and the symptoms continued. Otherwise, no specific medication, food, skin care product, detergent, soap, or other environmental triggers have been identified. The symptoms do not seem to correlate with NSAIDs use or emotional stress. She did have symptoms consistent with a respiratory tract infection at the time of symptom onset. Becky Crawford has tried to control symptoms with systemic steroids and OTC antihistamines which have offered adequate temporary relief. She has been evaluated and treated in the emergency department with SoluMedrol and prescribed hydroxyzine. Skin biopsy has not been performed. Recently, she has been experiencing hives daily while off of prednisone and antihistamines. Becky Crawford experiences nasal congestion, rhinorrhea, sneezing, postnasal drainage, nasal pruritus, and ocular pruritus.  The symptoms are most frequent during springtime and fall.  She attempts to control the symptoms with cetirizine or diphenhydramine.  Assessment and plan: Recurrent  urticaria Unclear etiology. Often times the onset of urticaria is secondary to viral infection. Once the mast cell membranes have been destabilized, it is not unusual for recurrent episodes of histamine release to occur in the ensuing weeks or months.  Skin tests to select food allergens were negative to all foods with the exception of lobster.  However, Khalise consumes crustaceans on a regular basis without symptoms, therefore this most likely represents a false positive result. NSAIDs and emotional stress commonly exacerbate urticaria but are not the underlying etiology in this case. Physical urticarias are negative by history (i.e. pressure-induced, temperature, vibration, solar, etc.). History and lesions are not consistent with urticaria pigmentosa so I am not suspicious for mastocytosis. There are no concomitant symptoms concerning for anaphylaxis or constitutional symptoms worrisome for an underlying malignancy. We will rule out other potential etiologies with labs. For symptom relief, patient is to take oral antihistamines as directed.  As she has experienced the sensation of throat tightness, she will continue to have access to epinephrine autoinjectors.  The following labs have been ordered: FCeRI antibody, anti-thyroglobulin antibody, thyroid peroxidase antibody, tryptase, H. pylori serology, CBC, CMP, ESR, ANA, and alpha-gal panel.  The patient will be called with further recommendations after lab results have returned.  Instructions have been discussed and provided for H1/H2 receptor blockade with titration to find lowest effective dose.  A prescription has been provided for levocetirizine (Xyzal), 5 mg daily as needed.  A prescription has been provided for famotidine (Pepcid), 2m twice daily as needed.  A prescription has been provided for montelukast 10 mg daily at bedtime.  Potential montelukast side effects have been discussed and the patient has verbalized understanding.  Should  there be a significant increase or change in symptoms, a journal is to be kept recording any foods  eaten, beverages consumed, medications taken within a 6 hour period prior to the onset of symptoms, as well as record activities being performed, and environmental conditions. For any symptoms concerning for anaphylaxis, epinephrine is to be administered and 911 is to be called immediately.  Angioedema Associated angioedema occurs in up to 50% of patients with chronic urticaria.  Treatment/diagnostic plan as outlined above.  Perennial allergic rhinitis  Aeroallergen avoidance measures have been discussed and provided in written form.  Levocetirizine has been prescribed (as above).  Montelukast has been prescribed (as above).  A prescription has been provided for azelastine nasal spray, 1-2 sprays per nostril 2 times daily as needed. Proper nasal spray technique has been discussed and demonstrated.   Nasal saline spray (i.e., Simply Saline) or nasal saline lavage (i.e., NeilMed) is recommended as needed and prior to medicated nasal sprays.  Allergic conjunctivitis  Treatment plan as outlined above for allergic rhinitis.  A prescription has been provided for Pataday, one drop per eye daily as needed.  I have also recommended eye lubricant drops (i.e., Natural Tears) as needed.   Meds ordered this encounter  Medications  . famotidine (PEPCID) 20 MG tablet    Sig: Take 1 tablet twice daily    Dispense:  64 tablet    Refill:  5  . levocetirizine (XYZAL) 5 MG tablet    Sig: Take one tablet once a day.    Dispense:  30 tablet    Refill:  5  . montelukast (SINGULAIR) 10 MG tablet    Sig: Take 1 tablet once daily at night for coughing or wheezing.    Dispense:  30 tablet    Refill:  5  . azelastine (ASTELIN) 0.1 % nasal spray    Sig: 1-2 sprays per nostril 2 times daily as needed    Dispense:  30 mL    Refill:  5  . Olopatadine HCl (PATADAY) 0.2 % SOLN    Sig: Instill 1 drop each  eye once daily if needed for itchy eyes    Dispense:  2.5 mL    Refill:  5    Diagnostics: Environmental skin testing: Reactive to mold and dust mite antigen. Food allergen skin testing: Reactive to lobster.  However, the patient consumes crustaceans on a regular basis without symptoms, therefore this represents a false positive result.    Physical examination: Blood pressure 104/70, pulse 92, temperature 98.5 F (36.9 C), temperature source Oral, resp. rate 20, height '5\' 10"'  (1.778 m), weight 245 lb 13 oz (111.5 kg), SpO2 97 %, unknown if currently breastfeeding.  General: Alert, interactive, in no acute distress. HEENT: TMs pearly gray, turbinates moderately edematous without discharge, post-pharynx mildly erythematous. Neck: Supple without lymphadenopathy. Lungs: Clear to auscultation without wheezing, rhonchi or rales. CV: Normal S1, S2 without murmurs. Abdomen: Nondistended, nontender. Skin: Warm and dry, without lesions or rashes. Extremities:  No clubbing, cyanosis or edema. Neuro:   Grossly intact.  Review of systems:  Review of systems negative except as noted in HPI / PMHx or noted below: Review of Systems  Constitutional: Negative.   HENT: Negative.   Eyes: Negative.   Respiratory: Negative.   Cardiovascular: Negative.   Gastrointestinal: Negative.   Genitourinary: Negative.   Musculoskeletal: Negative.   Skin: Negative.   Neurological: Negative.   Endo/Heme/Allergies: Negative.   Psychiatric/Behavioral: Negative.     Past medical history:  Past Medical History:  Diagnosis Date  . Hx of migraine headaches   . Medical history non-contributory   .  Urticaria     Past surgical history:  Past Surgical History:  Procedure Laterality Date  . APPENDECTOMY      Family history: Family History  Problem Relation Age of Onset  . Food Allergy Father     Social history: Social History   Socioeconomic History  . Marital status: Married    Spouse name: Not  on file  . Number of children: Not on file  . Years of education: Not on file  . Highest education level: Not on file  Occupational History  . Not on file  Tobacco Use  . Smoking status: Never Smoker  . Smokeless tobacco: Never Used  Substance and Sexual Activity  . Alcohol use: Not Currently  . Drug use: Never  . Sexual activity: Yes    Birth control/protection: None  Other Topics Concern  . Not on file  Social History Narrative  . Not on file   Social Determinants of Health   Financial Resource Strain:   . Difficulty of Paying Living Expenses: Not on file  Food Insecurity:   . Worried About Charity fundraiser in the Last Year: Not on file  . Ran Out of Food in the Last Year: Not on file  Transportation Needs:   . Lack of Transportation (Medical): Not on file  . Lack of Transportation (Non-Medical): Not on file  Physical Activity:   . Days of Exercise per Week: Not on file  . Minutes of Exercise per Session: Not on file  Stress:   . Feeling of Stress : Not on file  Social Connections:   . Frequency of Communication with Friends and Family: Not on file  . Frequency of Social Gatherings with Friends and Family: Not on file  . Attends Religious Services: Not on file  . Active Member of Clubs or Organizations: Not on file  . Attends Archivist Meetings: Not on file  . Marital Status: Not on file  Intimate Partner Violence:   . Fear of Current or Ex-Partner: Not on file  . Emotionally Abused: Not on file  . Physically Abused: Not on file  . Sexually Abused: Not on file    Environmental History: The patient lives in a 25 year old house with laminate floors throughout and central air/heat.  There is no known mold/water damage in the home.  There are no pets in the home.  She is a non-smoker.  Current Outpatient Medications  Medication Sig Dispense Refill  . Acetaminophen (TYLENOL PO) Take 2 tablets by mouth every 8 (eight) hours as needed (pain).    .  cetirizine (ZYRTEC) 10 MG tablet Take by mouth.    . EPINEPHrine 0.3 mg/0.3 mL IJ SOAJ injection Inject into the muscle.    . famotidine (PEPCID) 40 MG tablet Take one tablet daily    . ibuprofen (ADVIL,MOTRIN) 600 MG tablet Take 1 tablet (600 mg total) by mouth every 6 (six) hours. 40 tablet 0  . predniSONE (DELTASONE) 20 MG tablet Take 20 mg by mouth daily.     Marland Kitchen azelastine (ASTELIN) 0.1 % nasal spray 1-2 sprays per nostril 2 times daily as needed 30 mL 5  . famotidine (PEPCID) 20 MG tablet Take 1 tablet twice daily 64 tablet 5  . levocetirizine (XYZAL) 5 MG tablet Take one tablet once a day. 30 tablet 5  . montelukast (SINGULAIR) 10 MG tablet Take 1 tablet once daily at night for coughing or wheezing. 30 tablet 5  . Olopatadine HCl (PATADAY) 0.2 % SOLN Instill  1 drop each eye once daily if needed for itchy eyes 2.5 mL 5   No current facility-administered medications for this visit.    Known medication allergies: Allergies  Allergen Reactions  . Etonogestrel-Ethinyl Estradiol Itching  . Amoxicillin Rash    High fever, reaction as a child Has patient had a PCN reaction causing immediate rash, facial/tongue/throat swelling, SOB or lightheadedness with hypotension: No Has patient had a PCN reaction causing severe rash involving mucus membranes or skin necrosis: No Has patient had a PCN reaction that required hospitalization: No Has patient had a PCN reaction occurring within the last 10 years: No If all of the above answers are "NO", then may proceed with Cephalosporin use.   Marland Kitchen Penicillin G Rash    I appreciate the opportunity to take part in Lynnett's care. Please do not hesitate to contact me with questions.  Sincerely,   R. Edgar Frisk, MD

## 2019-06-02 ENCOUNTER — Other Ambulatory Visit: Payer: Self-pay

## 2019-06-02 MED ORDER — KETOTIFEN FUMARATE 0.025 % OP SOLN: 1.0000 [drp] | Freq: Two times a day (BID) | OPHTHALMIC | 5 refills | Status: DC | PRN

## 2019-08-31 ENCOUNTER — Telehealth: Payer: Self-pay | Admitting: Allergy and Immunology

## 2019-08-31 NOTE — Telephone Encounter (Signed)
Patient had bloodwork done in January and she has not received her results yet.  Please call patient regarding this,

## 2019-08-31 NOTE — Telephone Encounter (Signed)
I let the pt know that Dr. Nunzio Cobbs will not be back in the clinic until next Wednesday. I told her I would have Dr. Beaulah Dinning interpret the labs.

## 2019-08-31 NOTE — Telephone Encounter (Signed)
Received lab results via fax- placed on Dr. Maren Reamer door in [place].

## 2019-08-31 NOTE — Telephone Encounter (Signed)
Spoke with [facility] and they did not have any results on file for the year. I then called the lab at [facility] where she had them drawn at and they are faxing the results over to me.

## 2019-09-01 NOTE — Telephone Encounter (Signed)
I talked to the patient and reviewed her test results at [facility].  These results were scanned into the chart. She had a normal CBC with differential, negative serum ANA, no thyroid antibodies, negative Helicobacter antibodies, normal complete metabolic panel, negative alpha gal panel and a normal tryptase.  She had an elevated thyroglobulin and I ordered a serum TSH and a serum T4 to be done at [facility].  Her hives are much improved by using Zyrtec 10 mg once a day and Pepcid 20 mg once a day.  She will continue on this treatment plan but she understands that she could increase Zyrtec to 10 mg twice a day and Pepcid to 20 mg twice a day  She will follow-up with [place] if she continues to have hives

## 2019-09-07 NOTE — Telephone Encounter (Signed)
Call patient per Dr. Beaulah Dinning and let her know the TSH and serum T4 came in today and were within normal limits.  Called patient and gave information.  Patient will follow up as directed. These results were performed at [facility] laboratory and will be scanned into Epic.

## 2020-06-19 IMAGING — US US MFM OB DETAIL+14 WK
1 series · 13 of 28 positions shown · non-contrast
Comparison: none

[Series 1: us mfm ob detail+14 wk · 88 acquisitions, 13 frames shown]
[im 4/88]
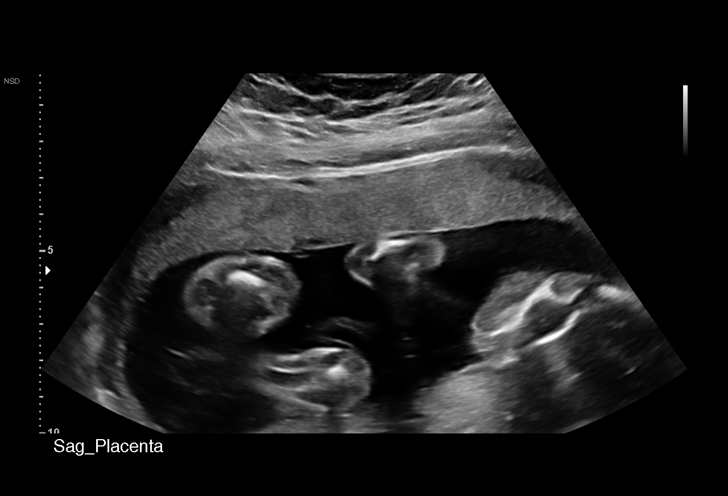
[im 10/88]
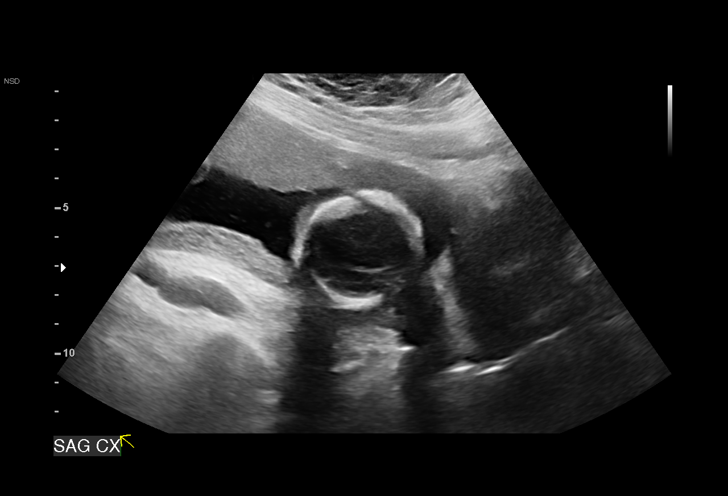
[im 17/88]
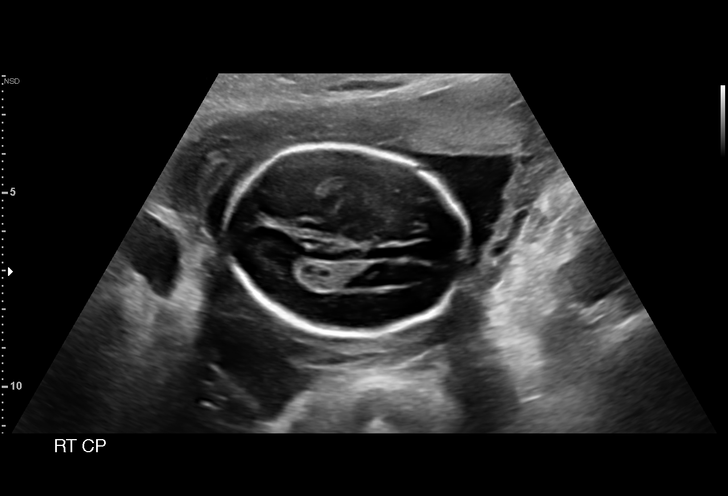
[im 23/88]
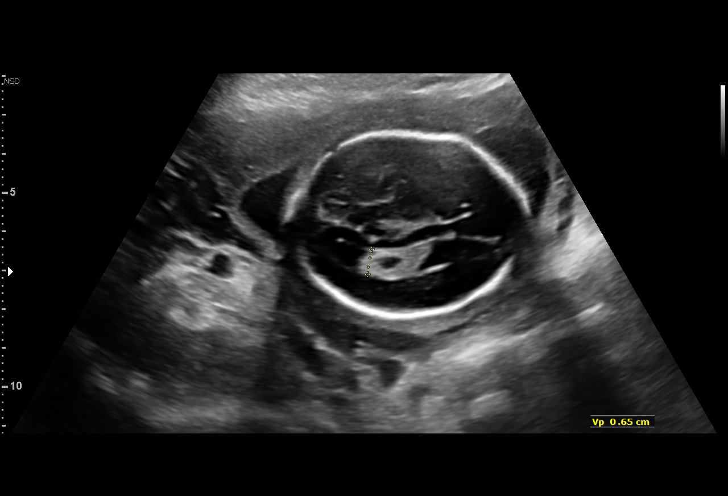
[im 30/88]
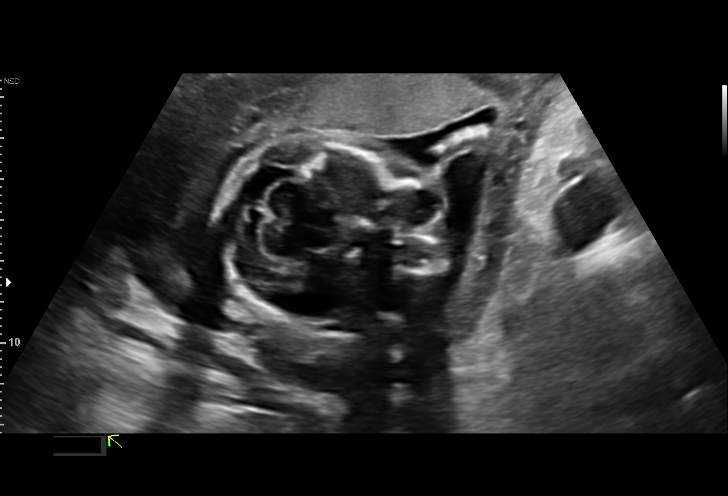
[im 36/88]
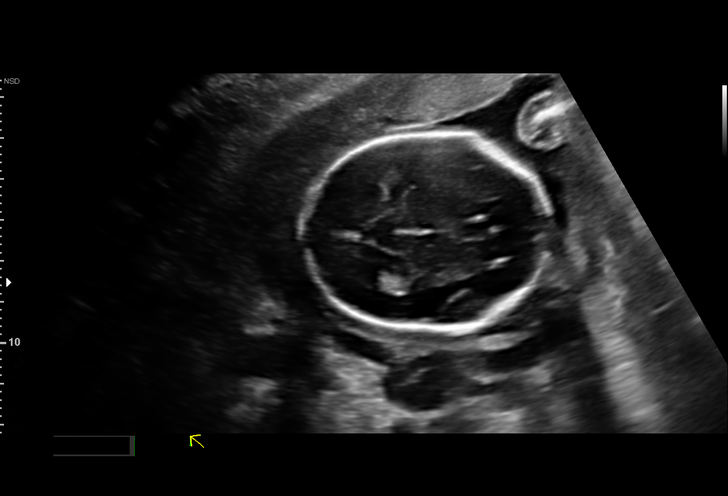
[im 46/88]
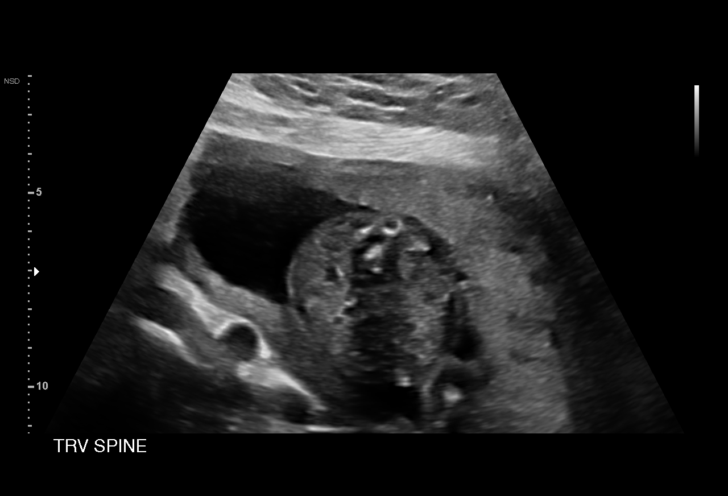
[im 52/88]
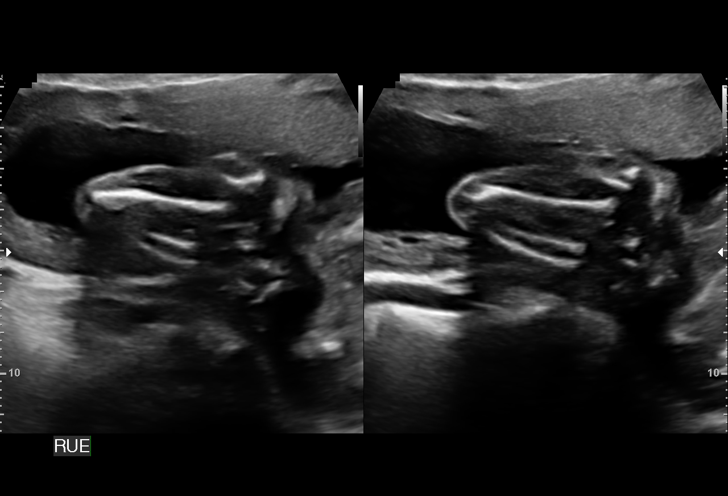
[im 59/88]
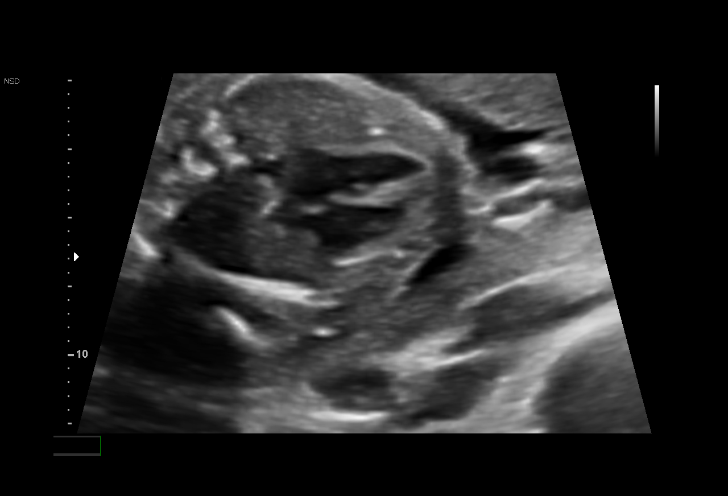
[im 65/88]
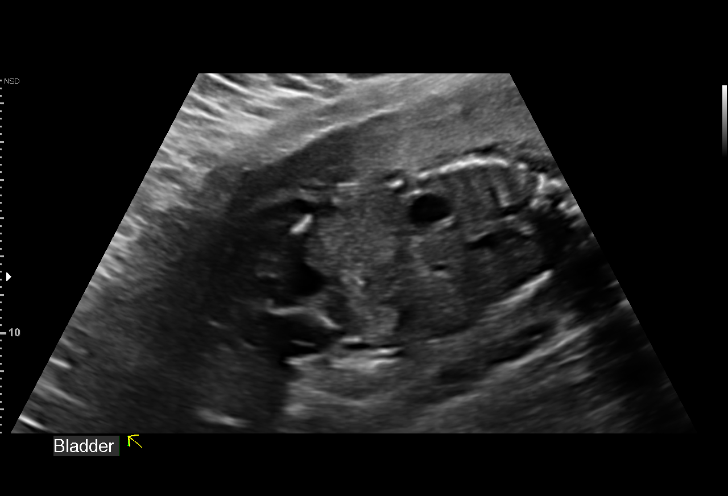
[im 71/88]
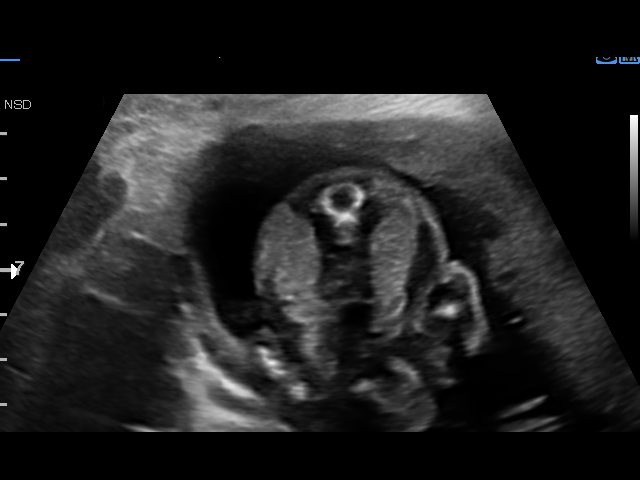
[im 78/88]
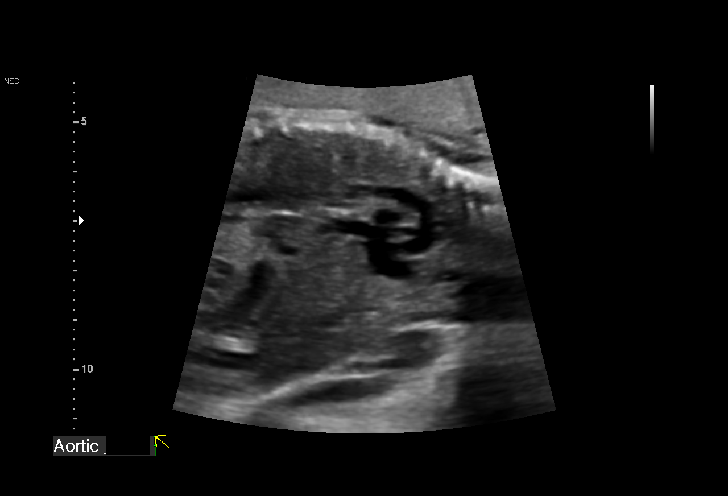
[im 84/88]
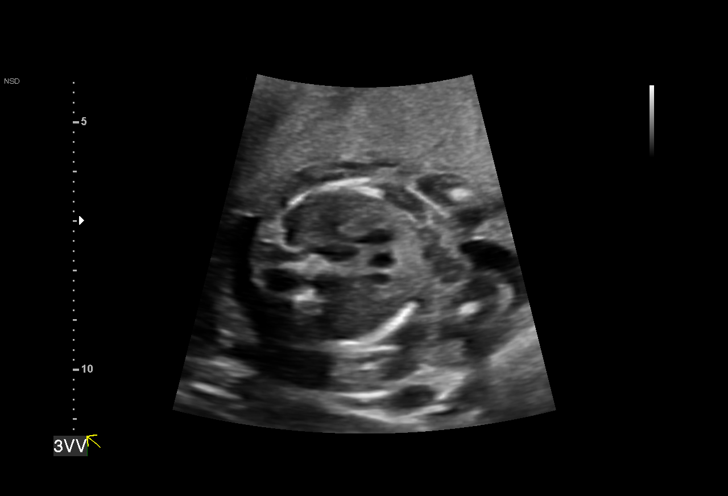

[13 of 28 positions shown; findings below may reference images not displayed]

OB/Gyn and
Infertility
OB/Gyn

1  DLEKE TIGER         [id number]      [id number]     [id number]
Indications

21 weeks gestation of pregnancy
Encounter for antenatal screening for
malformations
Fetal abnormality - other known or
suspected: choriod plexus cyst and renal
pyelectasis
Obesity complicating pregnancy, second
trimester (BMI 35)
OB History

Gravidity:    1         Term:   0        Prem:   0        SAB:   0
TOP:          0       Ectopic:  0        Living: 0
Fetal Evaluation

Num Of Fetuses:     1
Fetal Heart         146
Rate(bpm):
Cardiac Activity:   Observed
Presentation:       Cephalic
Placenta:           Anterior, above cervical os
P. Cord Insertion:  Marginal insertion

Amniotic Fluid
AFI FV:      Subjectively within normal limits

Largest Pocket(cm)
3.93
Biometry

BPD:      47.2  mm     G. Age:  20w 2d         17  %    CI:        71.81   %    70 - 86
FL/HC:      18.7   %    15.9 -
HC:      177.3  mm     G. Age:  20w 2d          9  %    HC/AC:      1.14        1.06 -
AC:      155.3  mm     G. Age:  20w 5d         29  %    FL/BPD:     70.3   %
FL:       33.2  mm     G. Age:  20w 3d         19  %    FL/AC:      21.4   %    20 - 24
HUM:      32.6  mm     G. Age:  21w 0d         42  %
CER:      21.1  mm     G. Age:  20w 1d         25  %

CM:        6.3  mm

Est. FW:     359  gm    0 lb 13 oz      31  %
Gestational Age

LMP:           21w 1d        Date:  06/10/17                 EDD:   03/17/18
U/S Today:     20w 3d                                        EDD:   03/22/18
Best:          21w 1d     Det. By:  LMP  (06/10/17)          EDD:   03/17/18
Anatomy

Cranium:               Appears normal         Aortic Arch:            Appears normal
Cavum:                 Appears normal         Ductal Arch:            Not well visualized
Ventricles:            Appears normal         Diaphragm:              Appears normal
Choroid Plexus:        Bilateral choroid      Stomach:                Appears normal, left
plexus cysts
sided
Cerebellum:            Appears normal         Abdomen:                Appears normal
Posterior Fossa:       Appears normal         Abdominal Wall:         Appears nml (cord
insert, abd wall)
Nuchal Fold:           Appears normal         Cord Vessels:           Appears normal (3
vessel cord)
Face:                  Orbits nl; profile not Kidneys:                Appear normal
well visualized
Lips:                  Appears normal         Bladder:                Appears normal
Thoracic:              Appears normal         Spine:                  Appears normal
Heart:                 Appears normal         Upper Extremities:      Visualized
(4CH, axis, and situs
RVOT:                  Appears normal         Lower Extremities:      Appears normal
LVOT:                  Appears normal

Other:  Fetus appears to be a female. Rt heel visualized
Cervix Uterus Adnexa

Cervix
Length:           3.18  cm.
Normal appearance by transabdominal scan.

Left Ovary
Within normal limits.

Right Ovary
Within normal limits.

Adnexa:       No abnormality visualized.
Impression

SIUP at 21+1 weeks
Normal detailed fetal anatomy; limited views of profile and the
DA; renal pelves normal
Markers of aneuploidy: small CP cysts
Normal amniotic fluid volume
Measurements consistent with LMP dating; EFW at the 31st
%tile

The US findings were shared with Ms.Yuchi and her
husband. The implications of CPCs and UTD A1 were
discussed in detail and their first trimester screening results
were reviewed. All of their prenatal testing options were
reviewed. After careful consideration, she declined
amniocentesis but decided to have cell free DNA screening.
Recommendations

Follow-up as clinically indicated
We will forward the results to you as soon as they are
available

## 2021-02-05 DIAGNOSIS — R002 Palpitations: Secondary | ICD-10-CM | POA: Insufficient documentation

## 2021-02-05 DIAGNOSIS — F32A Depression, unspecified: Secondary | ICD-10-CM | POA: Insufficient documentation

## 2021-02-05 DIAGNOSIS — F329 Major depressive disorder, single episode, unspecified: Secondary | ICD-10-CM | POA: Insufficient documentation

## 2021-02-07 ENCOUNTER — Other Ambulatory Visit: Payer: Self-pay

## 2021-02-07 DIAGNOSIS — Z8669 Personal history of other diseases of the nervous system and sense organs: Secondary | ICD-10-CM | POA: Insufficient documentation

## 2021-02-07 DIAGNOSIS — L509 Urticaria, unspecified: Secondary | ICD-10-CM | POA: Insufficient documentation

## 2021-02-07 DIAGNOSIS — Z789 Other specified health status: Secondary | ICD-10-CM | POA: Insufficient documentation

## 2021-02-07 DIAGNOSIS — R002 Palpitations: Secondary | ICD-10-CM | POA: Insufficient documentation

## 2021-02-08 ENCOUNTER — Encounter: Payer: Self-pay | Admitting: Cardiology

## 2021-02-08 ENCOUNTER — Ambulatory Visit: Payer: 59 | Admitting: Cardiology

## 2021-02-08 ENCOUNTER — Ambulatory Visit (INDEPENDENT_AMBULATORY_CARE_PROVIDER_SITE_OTHER): Payer: 59

## 2021-02-08 ENCOUNTER — Other Ambulatory Visit: Payer: Self-pay

## 2021-02-08 VITALS — BP 138/84 | HR 82 | Ht 70.0 in | Wt 218.0 lb

## 2021-02-08 DIAGNOSIS — R0602 Shortness of breath: Secondary | ICD-10-CM | POA: Diagnosis not present

## 2021-02-08 DIAGNOSIS — R0600 Dyspnea, unspecified: Secondary | ICD-10-CM | POA: Diagnosis not present

## 2021-02-08 DIAGNOSIS — Z13220 Encounter for screening for lipoid disorders: Secondary | ICD-10-CM

## 2021-02-08 DIAGNOSIS — R002 Palpitations: Secondary | ICD-10-CM | POA: Diagnosis not present

## 2021-02-08 DIAGNOSIS — I498 Other specified cardiac arrhythmias: Secondary | ICD-10-CM | POA: Diagnosis not present

## 2021-02-08 DIAGNOSIS — R0609 Other forms of dyspnea: Secondary | ICD-10-CM

## 2021-02-08 NOTE — Patient Instructions (Signed)
Medication Instructions:  Your physician recommends that you continue on your current medications as directed. Please refer to the Current Medication list given to you today.  *If you need a refill on your cardiac medications before your next appointment, please call your pharmacy*   Lab Work: Your physician recommends that you return for lab work in:  TODAY: BMET, Mag, CBC, TSH, Lipids If you have labs (blood work) drawn today and your tests are completely normal, you will receive your results only by: MyChart Message (if you have MyChart) OR A paper copy in the mail If you have any lab test that is abnormal or we need to change your treatment, we will call you to review the results.   Testing/Procedures: Your physician has requested that you have an echocardiogram. Echocardiography is a painless test that uses sound waves to create images of your heart. It provides your doctor with information about the size and shape of your heart and how well your heart's chambers and valves are working. This procedure takes approximately one hour. There are no restrictions for this procedure.  A zio monitor was ordered today. It will remain on for 14 days. You will then return monitor and event diary in provided box. It takes 1-2 weeks for report to be downloaded and returned to [place]. We will call you with the results. If monitor falls off or has orange flashing light, please call Zio for further instructions.   ZIO  WHY IS MY DOCTOR PRESCRIBING ZIO? The Zio system is proven and trusted by physicians to detect and diagnose irregular heart rhythms -- and has been prescribed to hundreds of thousands of patients.  The FDA has cleared the Zio system to monitor for many different kinds of irregular heart rhythms. In a study, physicians were able to reach a diagnosis 90% of the time with the Zio system1.  You can wear the Zio monitor -- a small, discreet, comfortable patch -- during your normal day-to-day  activity, including while you sleep, shower, and exercise, while it records every single heartbeat for analysis.  1Barrett, P., et al. Comparison of 24 Hour Holter Monitoring Versus 14 Day Novel Adhesive Patch Electrocardiographic Monitoring. American Journal of Medicine, 2014.  ZIO VS. HOLTER MONITORING The Zio monitor can be comfortably worn for up to 14 days. Holter monitors can be worn for 24 to 48 hours, limiting the time to record any irregular heart rhythms you may have. Zio is able to capture data for the 51% of patients who have their first symptom-triggered arrhythmia after 48 hours.1  LIVE WITHOUT RESTRICTIONS The Zio ambulatory cardiac monitor is a small, unobtrusive, and water-resistant patch--you might even forget you're wearing it. The Zio monitor records and stores every beat of your heart, whether you're sleeping, working out, or showering. Remove on: Oct 7th 2022   Follow-Up: At [facility], you and your health needs are our priority.  As part of our continuing mission to provide you with exceptional heart care, we have created designated Provider Care Teams.  These Care Teams include your primary Cardiologist (physician) and Advanced Practice Providers (APPs -  Physician Assistants and Nurse Practitioners) who all work together to provide you with the care you need, when you need it.  We recommend signing up for the patient portal called "MyChart".  Sign up information is provided on this After Visit Summary.  MyChart is used to connect with patients for Virtual Visits (Telemedicine).  Patients are able to view lab/test results, encounter notes, upcoming  appointments, etc.  Non-urgent messages can be sent to your provider as well.   To learn more about what you can do with MyChart, go to [email or web address].    Your next appointment:   6-8 week(s)  The format for your next appointment:   In Person  Provider:   Thomasene Ripple, DO [address], [place], [place]  [address]    Other Instructions

## 2021-02-08 NOTE — Progress Notes (Signed)
Cardiology Office Note:    Date:  02/08/2021   ID:  Becky Crawford, DOB Nov 11, 1995, MRN [id number]  PCP:  Leane Call, PA-C  Cardiologist:  None  Electrophysiologist:  None   Referring MD: Carrington Clamp, MD   " I have had some palpitations"   History of Present Illness:    Becky Crawford is a 25 y.o. female with a hx of migraine headaches was referred by her OB/GYN as she has been experiencing intermittent palpitations.  The patient works as a Engineer, civil (consulting) and tells me that she had an episode where she would explain significant palpitations her coworkers put a monitor on her which show some PVCs.  This is concerning for the patient as her family members mostly have atrial fibrillation.  She denies any chest pain.  She has had some intermittent shortness of breath.  Past Medical History:  Diagnosis Date   Hx of migraine headaches    Medical history non-contributory    Palpitations    Urticaria     Past Surgical History:  Procedure Laterality Date   APPENDECTOMY      Current Medications: Current Meds  Medication Sig   Acetaminophen (TYLENOL PO) Take 2 tablets by mouth every 8 (eight) hours as needed for headache (pain).   EPINEPHrine 0.3 mg/0.3 mL IJ SOAJ injection Inject 0.3 mg into the muscle as needed for anaphylaxis.   FLUoxetine (PROZAC) 20 MG capsule Take 20 mg by mouth daily.   ibuprofen (ADVIL,MOTRIN) 600 MG tablet Take 1 tablet (600 mg total) by mouth every 6 (six) hours. (Patient taking differently: Take 600 mg by mouth every 6 (six) hours as needed for headache.)   Olopatadine HCl (PATADAY) 0.2 % SOLN Instill 1 drop each eye once daily if needed for itchy eyes   Prenatal Vit-Fe Fumarate-FA (PRENATAL MULTIVITAMIN) TABS tablet Take 1 tablet by mouth daily at 12 noon.     Allergies:   Etonogestrel-ethinyl estradiol, Molds & smuts, Amoxicillin, and Penicillin g   Social History   Socioeconomic History   Marital status: Married    Spouse name: Not on  file   Number of children: Not on file   Years of education: Not on file   Highest education level: Not on file  Occupational History   Not on file  Tobacco Use   Smoking status: Never   Smokeless tobacco: Never  Vaping Use   Vaping Use: Never used  Substance and Sexual Activity   Alcohol use: Not Currently   Drug use: Never   Sexual activity: Yes    Birth control/protection: None  Other Topics Concern   Not on file  Social History Narrative   Not on file   Social Determinants of Health   Financial Resource Strain: Not on file  Food Insecurity: Not on file  Transportation Needs: Not on file  Physical Activity: Not on file  Stress: Not on file  Social Connections: Not on file     Family History: The patient's family history includes Food Allergy in her father; Hepatitis C in her father; Liver cancer in her father; Pulmonary embolism in her paternal aunt and paternal grandmother.  ROS:   Review of Systems  Constitution: Negative for decreased appetite, fever and weight gain.  HENT: Negative for congestion, ear discharge, hoarse voice and sore throat.   Eyes: Negative for discharge, redness, vision loss in right eye and visual halos.  Cardiovascular: Report palpitations and dyspnea exertion.  Negative for chest pain, leg swelling, orthopnea.  Respiratory:  Negative for cough, hemoptysis, shortness of breath and snoring.   Endocrine: Negative for heat intolerance and polyphagia.  Hematologic/Lymphatic: Negative for bleeding problem. Does not bruise/bleed easily.  Skin: Negative for flushing, nail changes, rash and suspicious lesions.  Musculoskeletal: Negative for arthritis, joint pain, muscle cramps, myalgias, neck pain and stiffness.  Gastrointestinal: Negative for abdominal pain, bowel incontinence, diarrhea and excessive appetite.  Genitourinary: Negative for decreased libido, genital sores and incomplete emptying.  Neurological: Negative for brief paralysis, focal  weakness, headaches and loss of balance.  Psychiatric/Behavioral: Negative for altered mental status, depression and suicidal ideas.  Allergic/Immunologic: Negative for HIV exposure and persistent infections.    EKGs/Labs/Other Studies Reviewed:    The following studies were reviewed today:   EKG:  The ekg ordered today demonstrates sinus rhythm, with sinus arrhythmia no PVC.  Recent Labs: No results found for requested labs within last 8760 hours.  Recent Lipid Panel No results found for: CHOL, TRIG, HDL, CHOLHDL, VLDL, LDLCALC, LDLDIRECT  Physical Exam:    VS:  BP 138/84   Pulse 82   Ht 5\' 10"  (1.778 m)   Wt 218 lb (98.9 kg)   SpO2 96%   BMI 31.28 kg/m     Wt Readings from Last 3 Encounters:  02/08/21 218 lb (98.9 kg)  06/01/19 245 lb 13 oz (111.5 kg)  03/18/18 247 lb 12.8 oz (112.4 kg)     GEN: Well nourished, well developed in no acute distress HEENT: Normal NECK: No JVD; No carotid bruits LYMPHATICS: No lymphadenopathy CARDIAC: S1S2 noted,RRR, no murmurs, rubs, gallops RESPIRATORY:  Clear to auscultation without rales, wheezing or rhonchi  ABDOMEN: Soft, non-tender, non-distended, +bowel sounds, no guarding. EXTREMITIES: No edema, No cyanosis, no clubbing MUSCULOSKELETAL:  No deformity  SKIN: Warm and dry NEUROLOGIC:  Alert and oriented x 3, non-focal PSYCHIATRIC:  Normal affect, good insight  ASSESSMENT:    1. DOE (dyspnea on exertion)   2. Palpitations   3. Sinus arrhythmia   4. SOB (shortness of breath)   5. Screening for hyperlipidemia    PLAN:    I would like to rule out a cardiovascular etiology of this palpitation, therefore at this time I would like to placed a zio patch for  14 days.   In addition with her dyspnea on exertion a transthoracic echocardiogram will be ordered to assess LV/RV function and any structural abnormalities. Once these testing have been performed amd reviewed further reccomendations will be made. For now, I do reccomend  that the patient goes to the nearest ED if  symptoms recur.  The patient understands the need to lose weight with diet and exercise. We have discussed specific strategies for this.  The patient is in agreement with the above plan. The patient left the office in stable condition.  The patient will follow up in 6 to 8 weeks.   Medication Adjustments/Labs and Tests Ordered: Current medicines are reviewed at length with the patient today.  Concerns regarding medicines are outlined above.  Orders Placed This Encounter  Procedures   Basic Metabolic Panel (BMET)   Magnesium   CBC with Differential/Platelet   TSH   Lipid panel   LONG TERM MONITOR (3-14 DAYS)   EKG 12-Lead   ECHOCARDIOGRAM COMPLETE   No orders of the defined types were placed in this encounter.   Patient Instructions  Medication Instructions:  Your physician recommends that you continue on your current medications as directed. Please refer to the Current Medication list given to you today.  *  If you need a refill on your cardiac medications before your next appointment, please call your pharmacy*   Lab Work: Your physician recommends that you return for lab work in:  TODAY: BMET, Mag, CBC, TSH, Lipids If you have labs (blood work) drawn today and your tests are completely normal, you will receive your results only by: MyChart Message (if you have MyChart) OR A paper copy in the mail If you have any lab test that is abnormal or we need to change your treatment, we will call you to review the results.   Testing/Procedures: Your physician has requested that you have an echocardiogram. Echocardiography is a painless test that uses sound waves to create images of your heart. It provides your doctor with information about the size and shape of your heart and how well your heart's chambers and valves are working. This procedure takes approximately one hour. There are no restrictions for this procedure.  A zio monitor was  ordered today. It will remain on for 14 days. You will then return monitor and event diary in provided box. It takes 1-2 weeks for report to be downloaded and returned to [place]. We will call you with the results. If monitor falls off or has orange flashing light, please call Zio for further instructions.   ZIO  WHY IS MY DOCTOR PRESCRIBING ZIO? The Zio system is proven and trusted by physicians to detect and diagnose irregular heart rhythms -- and has been prescribed to hundreds of thousands of patients.  The FDA has cleared the Zio system to monitor for many different kinds of irregular heart rhythms. In a study, physicians were able to reach a diagnosis 90% of the time with the Zio system1.  You can wear the Zio monitor -- a small, discreet, comfortable patch -- during your normal day-to-day activity, including while you sleep, shower, and exercise, while it records every single heartbeat for analysis.  1Barrett, P., et al. Comparison of 24 Hour Holter Monitoring Versus 14 Day Novel Adhesive Patch Electrocardiographic Monitoring. American Journal of Medicine, 2014.  ZIO VS. HOLTER MONITORING The Zio monitor can be comfortably worn for up to 14 days. Holter monitors can be worn for 24 to 48 hours, limiting the time to record any irregular heart rhythms you may have. Zio is able to capture data for the 51% of patients who have their first symptom-triggered arrhythmia after 48 hours.1  LIVE WITHOUT RESTRICTIONS The Zio ambulatory cardiac monitor is a small, unobtrusive, and water-resistant patch--you might even forget you're wearing it. The Zio monitor records and stores every beat of your heart, whether you're sleeping, working out, or showering. Remove on: Oct 7th 2022   Follow-Up: At [facility], you and your health needs are our priority.  As part of our continuing mission to provide you with exceptional heart care, we have created designated Provider Care Teams.  These Care Teams include  your primary Cardiologist (physician) and Advanced Practice Providers (APPs -  Physician Assistants and Nurse Practitioners) who all work together to provide you with the care you need, when you need it.  We recommend signing up for the patient portal called "MyChart".  Sign up information is provided on this After Visit Summary.  MyChart is used to connect with patients for Virtual Visits (Telemedicine).  Patients are able to view lab/test results, encounter notes, upcoming appointments, etc.  Non-urgent messages can be sent to your provider as well.   To learn more about what you can do with MyChart, go  to [email or web address].    Your next appointment:   6-8 week(s)  The format for your next appointment:   In Person  Provider:   Thomasene Ripple, DO [address], [place], [place] [address]    Other Instructions     Adopting a Healthy Lifestyle.  Know what a healthy weight is for you (roughly BMI <25) and aim to maintain this   Aim for 7+ servings of fruits and vegetables daily   65-80+ fluid ounces of water or unsweet tea for healthy kidneys   Limit to max 1 drink of alcohol per day; avoid smoking/tobacco   Limit animal fats in diet for cholesterol and heart health - choose grass fed whenever available   Avoid highly processed foods, and foods high in saturated/trans fats   Aim for low stress - take time to unwind and care for your mental health   Aim for 150 min of moderate intensity exercise weekly for heart health, and weights twice weekly for bone health   Aim for 7-9 hours of sleep daily   When it comes to diets, agreement about the perfect plan isnt easy to find, even among the experts. Experts at the [facility] of [employer] developed an idea known as the Healthy Eating Plate. Just imagine a plate divided into logical, healthy portions.   The emphasis is on diet quality:   Load up on vegetables and fruits - one-half of your plate: Aim for color and  variety, and remember that potatoes dont count.   Go for whole grains - one-quarter of your plate: Whole wheat, barley, wheat berries, quinoa, oats, brown rice, and foods made with them. If you want pasta, go with whole wheat pasta.   Protein power - one-quarter of your plate: Fish, chicken, beans, and nuts are all healthy, versatile protein sources. Limit red meat.   The diet, however, does go beyond the plate, offering a few other suggestions.   Use healthy plant oils, such as olive, canola, soy, corn, sunflower and peanut. Check the labels, and avoid partially hydrogenated oil, which have unhealthy trans fats.   If youre thirsty, drink water. Coffee and tea are good in moderation, but skip sugary drinks and limit milk and dairy products to one or two daily servings.   The type of carbohydrate in the diet is more important than the amount. Some sources of carbohydrates, such as vegetables, fruits, whole grains, and beans-are healthier than others.   Finally, stay active  Signed, Thomasene Ripple, DO  02/08/2021 1:19 PM    [facility]

## 2021-02-09 LAB — CBC WITH DIFFERENTIAL/PLATELET
Basophils Absolute: 0 10*3/uL (ref 0.0–0.2)
Basos: 0 %
EOS (ABSOLUTE): 0.1 10*3/uL (ref 0.0–0.4)
Eos: 1 %
Hematocrit: 43.6 % (ref 34.0–46.6)
Hemoglobin: 14.6 g/dL (ref 11.1–15.9)
Immature Grans (Abs): 0 10*3/uL (ref 0.0–0.1)
Immature Granulocytes: 0 %
Lymphocytes Absolute: 3.3 10*3/uL — ABNORMAL HIGH (ref 0.7–3.1)
Lymphs: 34 %
MCH: 29.3 pg (ref 26.6–33.0)
MCHC: 33.5 g/dL (ref 31.5–35.7)
MCV: 88 fL (ref 79–97)
Monocytes Absolute: 0.9 10*3/uL (ref 0.1–0.9)
Monocytes: 9 %
Neutrophils Absolute: 5.5 10*3/uL (ref 1.4–7.0)
Neutrophils: 56 %
Platelets: 240 10*3/uL (ref 150–450)
RBC: 4.98 x10E6/uL (ref 3.77–5.28)
RDW: 12.5 % (ref 11.7–15.4)
WBC: 9.7 10*3/uL (ref 3.4–10.8)

## 2021-02-09 LAB — BASIC METABOLIC PANEL
BUN/Creatinine Ratio: 25 — ABNORMAL HIGH (ref 9–23)
BUN: 16 mg/dL (ref 6–20)
CO2: 21 mmol/L (ref 20–29)
Calcium: 9.3 mg/dL (ref 8.7–10.2)
Chloride: 101 mmol/L (ref 96–106)
Creatinine, Ser: 0.63 mg/dL (ref 0.57–1.00)
Glucose: 91 mg/dL (ref 65–99)
Potassium: 3.8 mmol/L (ref 3.5–5.2)
Sodium: 136 mmol/L (ref 134–144)
eGFR: 126 mL/min/{1.73_m2} (ref >59)

## 2021-02-09 LAB — MAGNESIUM: Magnesium: 1.9 mg/dL (ref 1.6–2.3)

## 2021-02-09 LAB — TSH: TSH: 2.77 u[IU]/mL (ref 0.450–4.500)

## 2021-02-09 LAB — LIPID PANEL
Chol/HDL Ratio: 3.5 ratio (ref 0.0–4.4)
Cholesterol, Total: 147 mg/dL (ref 100–199)
HDL: 42 mg/dL (ref >39)
LDL Chol Calc (NIH): 93 mg/dL (ref 0–99)
Triglycerides: 56 mg/dL (ref 0–149)
VLDL Cholesterol Cal: 12 mg/dL (ref 5–40)

## 2021-02-21 ENCOUNTER — Other Ambulatory Visit: Payer: 59

## 2021-02-28 ENCOUNTER — Other Ambulatory Visit: Payer: Self-pay

## 2021-02-28 ENCOUNTER — Ambulatory Visit (INDEPENDENT_AMBULATORY_CARE_PROVIDER_SITE_OTHER): Payer: 59

## 2021-02-28 DIAGNOSIS — R0609 Other forms of dyspnea: Secondary | ICD-10-CM | POA: Diagnosis not present

## 2021-02-28 DIAGNOSIS — R0602 Shortness of breath: Secondary | ICD-10-CM

## 2021-02-28 LAB — ECHOCARDIOGRAM COMPLETE
Area-P 1/2: 5.27 cm2
S' Lateral: 3.7 cm

## 2021-03-27 ENCOUNTER — Telehealth (INDEPENDENT_AMBULATORY_CARE_PROVIDER_SITE_OTHER): Payer: 59 | Admitting: Cardiology

## 2021-03-27 ENCOUNTER — Encounter: Payer: Self-pay | Admitting: Cardiology

## 2021-03-27 VITALS — BP 128/76 | HR 84 | Ht 70.0 in | Wt 210.2 lb

## 2021-03-27 DIAGNOSIS — R002 Palpitations: Secondary | ICD-10-CM | POA: Diagnosis not present

## 2021-03-27 DIAGNOSIS — E669 Obesity, unspecified: Secondary | ICD-10-CM | POA: Diagnosis not present

## 2021-03-27 NOTE — Patient Instructions (Signed)

## 2021-03-27 NOTE — Progress Notes (Signed)
Virtual Visit via Video Note   This visit type was conducted due to national recommendations for restrictions regarding the COVID-19 Pandemic (e.g. social distancing) in an effort to limit this patient's exposure and mitigate transmission in our community.  Due to her co-morbid illnesses, this patient is at least at moderate risk for complications without adequate follow up.  This format is felt to be most appropriate for this patient at this time.  All issues noted in this document were discussed and addressed.  A limited physical exam was performed with this format.  Please refer to the patient's chart for her consent to telehealth for [facility].      Date:  03/27/2021   ID:  Becky Crawford, Becky Crawford 11-27-1995, MRN [id number]  Patient Location: Home Provider Location: Office/Clinic  Virtual Visit via Video  Note . I connected with the patient today by a  video enabled telemedicine application and verified that I am speaking with the correct person using two identifiers.  PCP:  Leane Call, PA-C  Cardiologist:  Thomasene Ripple, DO  Electrophysiologist:  None   Evaluation Performed:  Follow-Up Visit  Chief Complaint:  " I am still having palpitations"  History of Present Illness:    Becky Crawford is a 25 y.o. female with migraine headache and palpitations.  The patient was admitted in 2020 at that time she had history of intermittent palpitations.  She also had some shortness of breath.  At that time I recommended she wear a monitor and an echocardiogram. She was able to get her testing done. Her results was discuss with her.   The patient does not have symptoms concerning for COVID-19 infection (fever, chills, cough, or new shortness of breath).    Past Medical History:  Diagnosis Date   Hx of migraine headaches    Medical history non-contributory    Palpitations    Urticaria    Past Surgical History:  Procedure Laterality Date   APPENDECTOMY       Current  Meds  Medication Sig   Acetaminophen (TYLENOL PO) Take 2 tablets by mouth every 8 (eight) hours as needed for headache (pain).   EPINEPHrine 0.3 mg/0.3 mL IJ SOAJ injection Inject 0.3 mg into the muscle as needed for anaphylaxis.   FLUoxetine (PROZAC) 20 MG capsule Take 20 mg by mouth daily.   ibuprofen (ADVIL,MOTRIN) 600 MG tablet Take 1 tablet (600 mg total) by mouth every 6 (six) hours. (Patient taking differently: Take 600 mg by mouth every 6 (six) hours as needed for headache.)   Olopatadine HCl (PATADAY) 0.2 % SOLN Instill 1 drop each eye once daily if needed for itchy eyes   Prenatal Vit-Fe Fumarate-FA (PRENATAL MULTIVITAMIN) TABS tablet Take 1 tablet by mouth daily at 12 noon.     Allergies:   Etonogestrel-ethinyl estradiol, Molds & smuts, Amoxicillin, and Penicillin g   Social History   Tobacco Use   Smoking status: Never   Smokeless tobacco: Never  Vaping Use   Vaping Use: Never used  Substance Use Topics   Alcohol use: Not Currently   Drug use: Never     Family Hx: The patient's family history includes Food Allergy in her father; Hepatitis C in her father; Liver cancer in her father; Pulmonary embolism in her paternal aunt and paternal grandmother.  ROS:   Please see the history of present illness.     Review of Systems  Constitution: Negative for decreased appetite, fever and weight gain.  HENT: Negative for  congestion, ear discharge, hoarse voice and sore throat.   Eyes: Negative for discharge, redness, vision loss in right eye and visual halos.  Cardiovascular: Negative for chest pain, dyspnea on exertion, leg swelling, orthopnea and palpitations.  Respiratory: Negative for cough, hemoptysis, shortness of breath and snoring.   Endocrine: Negative for heat intolerance and polyphagia.  Hematologic/Lymphatic: Negative for bleeding problem. Does not bruise/bleed easily.  Skin: Negative for flushing, nail changes, rash and suspicious lesions.  Musculoskeletal: Negative  for arthritis, joint pain, muscle cramps, myalgias, neck pain and stiffness.  Gastrointestinal: Negative for abdominal pain, bowel incontinence, diarrhea and excessive appetite.  Genitourinary: Negative for decreased libido, genital sores and incomplete emptying.  Neurological: Negative for brief paralysis, focal weakness, headaches and loss of balance.  Psychiatric/Behavioral: Negative for altered mental status, depression and suicidal ideas.  Allergic/Immunologic: Negative for HIV exposure and persistent infections.   Prior CV studies:   The following studies were reviewed today: Echo 02/28/2021 IMPRESSIONS   1. Left ventricular ejection fraction, by estimation, is 60 to 65%. The left ventricle has normal function. The left ventricle has no regional wall motion abnormalities. Left ventricular diastolic parameters were normal.   2. Right ventricular systolic function is normal. The right ventricular size is normal. There is normal pulmonary artery systolic pressure.   3. The mitral valve is normal in structure. No evidence of mitral valve regurgitation. No evidence of mitral stenosis.   4. The aortic valve is normal in structure. Aortic valve regurgitation is not visualized. No aortic stenosis is present.   5. The inferior vena cava is normal in size with greater than 50% respiratory variability, suggesting right atrial pressure of 3 mmHg.   FINDINGS   Left Ventricle: Left ventricular ejection fraction, by estimation, is 60 to 65%. The left ventricle has normal function. The left ventricle has no regional wall motion abnormalities. The left ventricular internal cavity size was normal in size. There is  no left ventricular hypertrophy. Left ventricular diastolic parameters were normal.   Right Ventricle: The right ventricular size is normal. No increase in  right ventricular wall thickness. Right ventricular systolic function is  normal. There is normal pulmonary artery systolic pressure. The  tricuspid  regurgitant velocity is 2.24 m/s, and   with an assumed right atrial pressure of 3 mmHg, the estimated right  ventricular systolic pressure is 23.1 mmHg.   Left Atrium: Left atrial size was normal in size.   Right Atrium: Right atrial size was normal in size.   Pericardium: There is no evidence of pericardial effusion.   Mitral Valve: The mitral valve is normal in structure. No evidence of  mitral valve regurgitation. No evidence of mitral valve stenosis.   Tricuspid Valve: The tricuspid valve is normal in structure. Tricuspid  valve regurgitation is not demonstrated. No evidence of tricuspid  stenosis.   Aortic Valve: The aortic valve is normal in structure. Aortic valve  regurgitation is not visualized. No aortic stenosis is present.   Pulmonic Valve: The pulmonic valve was normal in structure. Pulmonic valve  regurgitation is not visualized. No evidence of pulmonic stenosis.   Aorta: The aortic root is normal in size and structure.   Venous: The inferior vena cava is normal in size with greater than 50%  respiratory variability, suggesting right atrial pressure of 3 mmHg.   IAS/Shunts: No atrial level shunt detected by color flow Doppler.   Zio monitor Patch Wear Time:  5 days and 20 hours starting February 08, 2021 Indication: Palpitations  Patient had a minimum HR of 41 bpm, maximum HR of 149 bpm, and average HR of 74 bpm.   Predominant underlying rhythm was Sinus Rhythm.   Premature atrial complexes were rare (<1.0%).  Premature ventricular complexes were rare (<1.0%).   Symptoms were associated with rare premature atrial complexes and rare premature ventricular complexes.    No supraventricular tachycardia, atrial fibrillation or ventricular tachycardia noted.   Conclusion: Symptomatic rare PACs and PVCs.   Labs/Other Tests and Data Reviewed:    EKG:  No ECG reviewed.  Recent Labs: 02/08/2021: BUN 16; Creatinine, Ser 0.63; Hemoglobin 14.6;  Magnesium 1.9; Platelets 240; Potassium 3.8; Sodium 136; TSH 2.770   Recent Lipid Panel Lab Results  Component Value Date/Time   CHOL 147 02/08/2021 10:56 AM   TRIG 56 02/08/2021 10:56 AM   HDL 42 02/08/2021 10:56 AM   CHOLHDL 3.5 02/08/2021 10:56 AM   LDLCALC 93 02/08/2021 10:56 AM    Wt Readings from Last 3 Encounters:  03/27/21 210 lb 3.2 oz (95.3 kg)  02/08/21 218 lb (98.9 kg)  06/01/19 245 lb 13 oz (111.5 kg)     Objective:    Vital Signs:  BP 128/76   Pulse 84   Ht 5\' 10"  (1.778 m)   Wt 210 lb 3.2 oz (95.3 kg)   SpO2 99%   BMI 30.16 kg/m      ASSESSMENT & PLAN:    Palpitations-she has had some palpitations but mostly related to the fact that she drinks more coffee now since she has had but shift change.  I have encouraged the patient that it would be better to cut back on the coffee.  Instead of adding medication to her regimen since this is the precipitant of her palpitation. She is agreeable to this.  Obesity-the patient understands the need to lose weight with diet and exercise. We have discussed specific strategies for this.  COVID-19 Education: The signs and symptoms of COVID-19 were discussed with the patient and how to seek care for testing (follow up with PCP or arrange E-visit).  The importance of social distancing was discussed today.  Time:   Today, I have spent 15 minutes with the patient with telehealth technology discussing the above problems.     Medication Adjustments/Labs and Tests Ordered: Current medicines are reviewed at length with the patient today.  Concerns regarding medicines are outlined above.   Tests Ordered: No orders of the defined types were placed in this encounter.   Medication Changes: No orders of the defined types were placed in this encounter.   Follow Up:  In Person prn  , DO  03/27/2021 10:42 AM    [facility]

## 2021-05-07 LAB — OB RESULTS CONSOLE GC/CHLAMYDIA
Chlamydia: NEGATIVE
Neisseria Gonorrhea: NEGATIVE

## 2021-05-19 NOTE — L&D Delivery Note (Signed)
Patient was C/C/+3 and pushed for 3 minutes with epidural.    NSVD  fmelae infant, Apgars 8,9, weight P.   The patient had second degree midline perineal laceration repaired with 2-0 vicryl R. Fundus was firm. EBL was expected amount. Placenta was delivered intact. Vagina was clear.  Delayed cord clamping done for 30-60 seconds while warming baby. Baby was vigorous and doing skin to skin with mother.  Becky Crawford

## 2021-06-04 LAB — OB RESULTS CONSOLE HIV ANTIBODY (ROUTINE TESTING): HIV: NONREACTIVE

## 2021-06-04 LAB — OB RESULTS CONSOLE ABO/RH: RH Type: POSITIVE

## 2021-06-04 LAB — OB RESULTS CONSOLE RPR: RPR: NONREACTIVE

## 2021-06-04 LAB — OB RESULTS CONSOLE RUBELLA ANTIBODY, IGM: Rubella: IMMUNE

## 2021-06-04 LAB — OB RESULTS CONSOLE HEPATITIS B SURFACE ANTIGEN: Hepatitis B Surface Ag: NEGATIVE

## 2021-06-04 LAB — OB RESULTS CONSOLE ANTIBODY SCREEN: Antibody Screen: NEGATIVE

## 2021-06-04 LAB — HEPATITIS C ANTIBODY: HCV Ab: NEGATIVE

## 2021-10-14 ENCOUNTER — Inpatient Hospital Stay (HOSPITAL_COMMUNITY)
Admission: AD | Admit: 2021-10-14 | Discharge: 2021-10-14 | Disposition: A | Discharge status: Home / Self Care | Payer: 59 | Attending: Obstetrics and Gynecology | Admitting: Obstetrics and Gynecology

## 2021-10-14 ENCOUNTER — Encounter (HOSPITAL_COMMUNITY): Payer: Self-pay | Admitting: *Deleted

## 2021-10-14 DIAGNOSIS — Z3689 Encounter for other specified antenatal screening: Secondary | ICD-10-CM | POA: Insufficient documentation

## 2021-10-14 DIAGNOSIS — Z3A30 30 weeks gestation of pregnancy: Secondary | ICD-10-CM | POA: Insufficient documentation

## 2021-10-14 DIAGNOSIS — O26893 Other specified pregnancy related conditions, third trimester: Secondary | ICD-10-CM | POA: Diagnosis not present

## 2021-10-14 DIAGNOSIS — N898 Other specified noninflammatory disorders of vagina: Secondary | ICD-10-CM | POA: Diagnosis not present

## 2021-10-14 LAB — URINALYSIS, ROUTINE W REFLEX MICROSCOPIC
Bilirubin Urine: NEGATIVE
Glucose, UA: NEGATIVE mg/dL
Hgb urine dipstick: NEGATIVE
Ketones, ur: NEGATIVE mg/dL
Leukocytes,Ua: NEGATIVE
Nitrite: NEGATIVE
Protein, ur: NEGATIVE mg/dL
Specific Gravity, Urine: 1.003 — ABNORMAL LOW (ref 1.005–1.030)
pH: 7 (ref 5.0–8.0)

## 2021-10-14 LAB — AMNISURE RUPTURE OF MEMBRANE (ROM) NOT AT ARMC: Amnisure ROM: NEGATIVE

## 2021-10-14 LAB — POCT FERN TEST: POCT Fern Test: NEGATIVE

## 2021-10-14 NOTE — MAU Provider Note (Signed)
History   [id number]   Chief Complaint  Patient presents with   Rupture of Membranes   Abdominal Pain    HPI Becky Crawford is a 26 y.o. female  [phone] @30 .3 wks here with report of leaking clear fluid around 2pm today.  No gush of fluid but it did get on her underwear. Leaking of fluid has continued. Pt reports no contractions but some cramping. She denies vaginal bleeding. Last intercourse was not recent. She reports good fetal movement. All other systems negative.    No LMP recorded. Patient is pregnant.  OB History  Gravida Para Term Preterm AB Living  4 1 1   2 1   SAB IAB Ectopic Multiple Live Births  2     0 1    # Outcome Date GA Lbr Len/2nd Weight Sex Delivery Anes PTL Lv  4 Current           3 Term 03/18/18 [redacted]w[redacted]d 20:17 / 00:34 3465 g F Vag-Spont EPI  LIV  2 SAB           1 SAB             Past Medical History:  Diagnosis Date   Hx of migraine headaches    Medical history non-contributory    Palpitations    Urticaria     Family History  Problem Relation Age of Onset   Food Allergy Father    Liver cancer Father    Hepatitis C Father    Pulmonary embolism Paternal Grandmother    Pulmonary embolism Paternal Aunt     Social History   Socioeconomic History   Marital status: Married    Spouse name: Not on file   Number of children: Not on file   Years of education: Not on file   Highest education level: Not on file  Occupational History   Not on file  Tobacco Use   Smoking status: Never   Smokeless tobacco: Never  Vaping Use   Vaping Use: Never used  Substance and Sexual Activity   Alcohol use: Not Currently   Drug use: Never   Sexual activity: Yes    Birth control/protection: None  Other Topics Concern   Not on file  Social History Narrative   Not on file   Social Determinants of Health   Financial Resource Strain: Not on file  Food Insecurity: Not on file  Transportation Needs: Not on file  Physical Activity: Not on file  Stress:  Not on file  Social Connections: Not on file    Allergies  Allergen Reactions   Etonogestrel-Ethinyl Estradiol Itching   Molds & Smuts Hives   Amoxicillin Rash    High fever, reaction as a child Has patient had a PCN reaction causing immediate rash, facial/tongue/throat swelling, SOB or lightheadedness with hypotension: No Has patient had a PCN reaction causing severe rash involving mucus membranes or skin necrosis: No Has patient had a PCN reaction that required hospitalization: No Has patient had a PCN reaction occurring within the last 10 years: No If all of the above answers are "NO", then may proceed with Cephalosporin use.    Penicillin G Rash    No current facility-administered medications on file prior to encounter.   Current Outpatient Medications on File Prior to Encounter  Medication Sig Dispense Refill   FLUoxetine (PROZAC) 20 MG capsule Take 20 mg by mouth daily.     Prenatal Vit-Fe Fumarate-FA (PRENATAL MULTIVITAMIN) TABS tablet Take 1 tablet by mouth daily  at 12 noon.     Acetaminophen (TYLENOL PO) Take 2 tablets by mouth every 8 (eight) hours as needed for headache (pain).     EPINEPHrine 0.3 mg/0.3 mL IJ SOAJ injection Inject 0.3 mg into the muscle as needed for anaphylaxis.     ibuprofen (ADVIL,MOTRIN) 600 MG tablet Take 1 tablet (600 mg total) by mouth every 6 (six) hours. (Patient taking differently: Take 600 mg by mouth every 6 (six) hours as needed for headache.) 40 tablet 0   Olopatadine HCl (PATADAY) 0.2 % SOLN Instill 1 drop each eye once daily if needed for itchy eyes 2.5 mL 5     Review of Systems  Gastrointestinal:  Positive for abdominal pain.  Genitourinary:  Positive for vaginal discharge. Negative for vaginal bleeding.    Physical Exam   Vitals:   10/14/21 1606  BP: 121/67  Pulse: 77  Resp: 18  Temp: 99.4 F (37.4 C)  TempSrc: Oral  SpO2: 99%  Weight: 110.4 kg  Height: 5\' 10"  (1.778 m)    Physical Exam Vitals and nursing note  reviewed. Exam conducted with a chaperone present.  Constitutional:      General: She is not in acute distress.    Appearance: Normal appearance.  HENT:     Head: Normocephalic and atraumatic.  Cardiovascular:     Rate and Rhythm: Normal rate.  Pulmonary:     Effort: Pulmonary effort is normal.  Abdominal:     Tenderness: There is no abdominal tenderness.  Genitourinary:    Comments: SSE: no pool SVE: closed/thick Musculoskeletal:        General: Normal range of motion.     Cervical back: Normal range of motion.  Skin:    General: Skin is warm and dry.  Neurological:     General: No focal deficit present.     Mental Status: She is alert and oriented to person, place, and time.  Psychiatric:        Mood and Affect: Mood normal.        Behavior: Behavior normal.  EFM: 130 bpm, mod variability, + accels, no decels Toco: none  Results for orders placed or performed during the hospital encounter of 10/14/21 (from the past 24 hour(s))  Urinalysis, Routine w reflex microscopic Urine, Clean Catch     Status: Abnormal   Collection Time: 10/14/21  4:34 PM  Result Value Ref Range   Color, Urine STRAW (A) YELLOW   APPearance CLEAR CLEAR   Specific Gravity, Urine 1.003 (L) 1.005 - 1.030   pH 7.0 5.0 - 8.0   Glucose, UA NEGATIVE NEGATIVE mg/dL   Hgb urine dipstick NEGATIVE NEGATIVE   Bilirubin Urine NEGATIVE NEGATIVE   Ketones, ur NEGATIVE NEGATIVE mg/dL   Protein, ur NEGATIVE NEGATIVE mg/dL   Nitrite NEGATIVE NEGATIVE   Leukocytes,Ua NEGATIVE NEGATIVE  Amnisure rupture of membrane (rom)not at [facility]     Status: None   Collection Time: 10/14/21  5:16 PM  Result Value Ref Range   Amnisure ROM NEGATIVE   Fern Test     Status: None   Collection Time: 10/14/21  6:39 PM  Result Value Ref Range   POCT Fern Test Negative = intact amniotic membranes    MAU Course  Procedures  MDM Labs ordered and reviewed. No signs of SROM or PTL. Stable for discharge home.   Assessment and Plan    1. [redacted] weeks gestation of pregnancy   2. NST (non-stress test) reactive   3. Vaginal discharge during pregnancy  in third trimester    Discharge [facility] as [facility] PTL precautions  Allergies as of 10/14/2021       Reactions   Etonogestrel-ethinyl Estradiol Itching   Molds & Smuts Hives   Amoxicillin Rash   High fever, reaction as a child Has patient had a PCN reaction causing immediate rash, facial/tongue/throat swelling, SOB or lightheadedness with hypotension: No Has patient had a PCN reaction causing severe rash involving mucus membranes or skin necrosis: No Has patient had a PCN reaction that required hospitalization: No Has patient had a PCN reaction occurring within the last 10 years: No If all of the above answers are "NO", then may proceed with Cephalosporin use.   Penicillin G Rash        Medication List     STOP taking these medications    ibuprofen 600 MG tablet Commonly known as: ADVIL       TAKE these medications    EPINEPHrine 0.3 mg/0.3 mL Soaj injection Commonly known as: EPI-PEN Inject 0.3 mg into the muscle as needed for anaphylaxis.   FLUoxetine 20 MG capsule Commonly known as: PROZAC Take 20 mg by mouth daily.   Olopatadine HCl 0.2 % Soln Commonly known as: Pataday Instill 1 drop each eye once daily if needed for itchy eyes   prenatal multivitamin Tabs tablet Take 1 tablet by mouth daily at 12 noon.   TYLENOL PO Take 2 tablets by mouth every 8 (eight) hours as needed for headache (pain).        Donette LarryBhambri, Demetrice Combes, CNM 10/14/2021 6:40 PM

## 2021-10-14 NOTE — MAU Note (Signed)
Becky Crawford is a 26 y.o. at [redacted]w[redacted]d here in MAU reporting: thinks she might be leaking fluid, felt some wetnss around 1430, went to br- underwear was wet- clear fluid, clear when she wipes.  Has continued.  Has been cramping off and on today. No bleeding, no recent intercourse  Onset of complaint: 1430 Pain score: 3 Vitals:   10/14/21 1606  BP: 121/67  Pulse: 77  Resp: 18  Temp: 99.4 F (37.4 C)  SpO2: 99%     FHT:154 Lab orders placed from triage:

## 2021-11-22 LAB — OB RESULTS CONSOLE GBS: GBS: NEGATIVE

## 2021-12-19 ENCOUNTER — Other Ambulatory Visit: Payer: Self-pay | Admitting: Obstetrics and Gynecology

## 2021-12-19 DIAGNOSIS — O480 Post-term pregnancy: Secondary | ICD-10-CM

## 2021-12-20 ENCOUNTER — Inpatient Hospital Stay (EMERGENCY_DEPARTMENT_HOSPITAL)
Admission: AD | Admit: 2021-12-20 | Discharge: 2021-12-20 | Disposition: A | Discharge status: Home / Self Care | Payer: 59 | Source: Home / Self Care | Attending: Obstetrics and Gynecology | Admitting: Obstetrics and Gynecology

## 2021-12-20 ENCOUNTER — Telehealth (HOSPITAL_COMMUNITY): Payer: Self-pay | Admitting: *Deleted

## 2021-12-20 ENCOUNTER — Other Ambulatory Visit: Payer: Self-pay

## 2021-12-20 ENCOUNTER — Encounter (HOSPITAL_COMMUNITY): Payer: Self-pay | Admitting: Obstetrics and Gynecology

## 2021-12-20 DIAGNOSIS — O471 False labor at or after 37 completed weeks of gestation: Secondary | ICD-10-CM | POA: Insufficient documentation

## 2021-12-20 DIAGNOSIS — Z3A40 40 weeks gestation of pregnancy: Secondary | ICD-10-CM | POA: Insufficient documentation

## 2021-12-20 DIAGNOSIS — Z0371 Encounter for suspected problem with amniotic cavity and membrane ruled out: Secondary | ICD-10-CM | POA: Insufficient documentation

## 2021-12-20 LAB — WET PREP, GENITAL
Clue Cells Wet Prep HPF POC: NONE SEEN
Sperm: NONE SEEN
Trich, Wet Prep: NONE SEEN
WBC, Wet Prep HPF POC: >=10 — AB (ref <10)
Yeast Wet Prep HPF POC: NONE SEEN

## 2021-12-20 LAB — AMNISURE RUPTURE OF MEMBRANE (ROM) NOT AT ARMC: Amnisure ROM: NEGATIVE

## 2021-12-20 LAB — POCT FERN TEST: POCT Fern Test: NEGATIVE

## 2021-12-20 NOTE — MAU Provider Note (Signed)
Event Date/Time  First Provider Initiated Contact with Patient 12/20/21 1616     S: Ms. Becky Crawford is a 26 y.o. [id number] at [redacted]w[redacted]d  who presents to MAU today complaining of abnormal vaginal discharge, unsure if it is amniotic fluid. She denies vaginal bleeding. She endorses crampy contractions and had her membranes swept in the office last Wednesday. She reports normal fetal movement.    O: BP 136/77   Pulse 88   Temp 98 F (36.7 C) (Oral)   Resp 18   Ht 5\' 10"  (1.778 m)   Wt 117.1 kg   SpO2 98%   BMI 37.03 kg/m  GENERAL: Well-developed, well-nourished female in no acute distress.  HEAD: Normocephalic, atraumatic.  CHEST: Normal effort of breathing, regular heart rate ABDOMEN: Soft, nontender, gravid PELVIC: Normal external female genitalia. Vagina is pink and rugated. Cervix with normal contour, no lesions. Normal discharge.  Negative pooling.   Cervical exam: 3/thick/-3 unchanged from previous    Fetal Monitoring: Baseline: 125 Variability: Mod Accelerations: 15 x 15 Decelerations: N/A Contractions: Irregular q 5-6 min  Results for orders placed or performed during the hospital encounter of 12/20/21 (from the past 24 hour(s))  POCT fern test     Status: Normal   Collection Time: 12/20/21  2:47 PM  Result Value Ref Range   POCT Fern Test Negative = intact amniotic membranes   Amnisure rupture of membrane (rom)not at [facility]     Status: None   Collection Time: 12/20/21  3:00 PM  Result Value Ref Range   Amnisure ROM NEGATIVE      A: [facility] at [redacted]w[redacted]d  Patient initially managed and discharged by Dr. [redacted]w[redacted]d CNM notified by RN of ongoing report of vaginal discharge s/p orders for discharge home CNM requested at bedside, physical exam including pelvic performed at that time Cat I tracing Membranes intact Normal wet prep  P: Discharge home in stable condition  F/U: Patient's next appointment is 12/25/2021, IOL 12/26/2021  02/25/2022, CNM 12/20/2021 4:42  PM

## 2021-12-20 NOTE — Telephone Encounter (Signed)
Preadmission screen  

## 2021-12-20 NOTE — MAU Note (Signed)
Becky Crawford is a 26 y.o. at [redacted]w[redacted]d here in MAU reporting: irregular ctxs, cramping, and fouls smelling bloody vaginal discharge.  Reports had membranes stripped on Wednesday.  Reports unable to determine if LOF.  Endorses +FM. LMP: N/A Onset of complaint: yesterday Pain score: 3 Vitals:   12/20/21 1413  BP: 134/78  Pulse: (!) 113  Resp: 18  Temp: 98 F (36.7 C)  SpO2: 99%     FHT:133 bpm Lab orders placed from triage:  None

## 2021-12-20 NOTE — Discharge Instructions (Signed)
2/3-1-1 Rule Go to MAU for painful contractions every 2-3 minutes, lasting 1 minute each for 1.5 hours.  

## 2021-12-21 ENCOUNTER — Encounter (HOSPITAL_COMMUNITY): Payer: Self-pay | Admitting: Obstetrics and Gynecology

## 2021-12-21 ENCOUNTER — Inpatient Hospital Stay (HOSPITAL_COMMUNITY)
Admission: AD | Admit: 2021-12-21 | Discharge: 2021-12-23 | DRG: 807 | Disposition: A | Discharge status: Home / Self Care | Payer: 59 | Attending: Obstetrics and Gynecology | Admitting: Obstetrics and Gynecology

## 2021-12-21 DIAGNOSIS — Z3A40 40 weeks gestation of pregnancy: Secondary | ICD-10-CM | POA: Diagnosis not present

## 2021-12-21 DIAGNOSIS — O480 Post-term pregnancy: Principal | ICD-10-CM | POA: Diagnosis present

## 2021-12-21 LAB — CBC
HCT: 40.7 % (ref 36.0–46.0)
Hemoglobin: 13.4 g/dL (ref 12.0–15.0)
MCH: 28 pg (ref 26.0–34.0)
MCHC: 32.9 g/dL (ref 30.0–36.0)
MCV: 85 fL (ref 80.0–100.0)
Platelets: 237 10*3/uL (ref 150–400)
RBC: 4.79 MIL/uL (ref 3.87–5.11)
RDW: 14.6 % (ref 11.5–15.5)
WBC: 18 10*3/uL — ABNORMAL HIGH (ref 4.0–10.5)
nRBC: 0 % (ref 0.0–0.2)

## 2021-12-21 LAB — COMPREHENSIVE METABOLIC PANEL
ALT: 15 U/L (ref 0–44)
AST: 18 U/L (ref 15–41)
Albumin: 2.7 g/dL — ABNORMAL LOW (ref 3.5–5.0)
Alkaline Phosphatase: 112 U/L (ref 38–126)
Anion gap: 8 (ref 5–15)
BUN: 7 mg/dL (ref 6–20)
CO2: 20 mmol/L — ABNORMAL LOW (ref 22–32)
Calcium: 9.5 mg/dL (ref 8.9–10.3)
Chloride: 106 mmol/L (ref 98–111)
Creatinine, Ser: 0.56 mg/dL (ref 0.44–1.00)
GFR, Estimated: >60 mL/min (ref >60)
Glucose, Bld: 95 mg/dL (ref 70–99)
Potassium: 3.5 mmol/L (ref 3.5–5.1)
Sodium: 134 mmol/L — ABNORMAL LOW (ref 135–145)
Total Bilirubin: 1.2 mg/dL (ref 0.3–1.2)
Total Protein: 6.4 g/dL — ABNORMAL LOW (ref 6.5–8.1)

## 2021-12-21 LAB — TYPE AND SCREEN
ABO/RH(D): B POS
Antibody Screen: NEGATIVE

## 2021-12-21 LAB — PROTEIN / CREATININE RATIO, URINE
Creatinine, Urine: 74 mg/dL
Protein Creatinine Ratio: 0.11 mg/mg{Cre} (ref 0.00–0.15)
Total Protein, Urine: 8 mg/dL

## 2021-12-21 MED ORDER — OXYCODONE-ACETAMINOPHEN 5-325 MG PO TABS: 1.0000 | ORAL_TABLET | ORAL | Status: DC | PRN

## 2021-12-21 MED ORDER — ONDANSETRON HCL 4 MG/2ML IJ SOLN
4.0000 mg | Freq: Four times a day (QID) | INTRAMUSCULAR | Status: DC | PRN
Start: 2021-12-21 — End: 2021-12-22
  Administered 2021-12-21 – 2021-12-22 (×2): 4 mg via INTRAVENOUS
  Filled 2021-12-21 (×2): qty 2

## 2021-12-21 MED ORDER — OXYCODONE-ACETAMINOPHEN 5-325 MG PO TABS: 2.0000 | ORAL_TABLET | ORAL | Status: DC | PRN

## 2021-12-21 MED ORDER — LACTATED RINGERS IV SOLN
500.0000 mL | INTRAVENOUS | Status: DC | PRN
  Administered 2021-12-21 – 2021-12-22 (×3): 500 mL via INTRAVENOUS

## 2021-12-21 MED ORDER — ACETAMINOPHEN 325 MG PO TABS
650.0000 mg | ORAL_TABLET | ORAL | Status: DC | PRN
  Administered 2021-12-22: 650 mg via ORAL
  Filled 2021-12-21: qty 2

## 2021-12-21 MED ORDER — OXYTOCIN BOLUS FROM INFUSION
333.0000 mL | Freq: Once | INTRAVENOUS | Status: AC
  Administered 2021-12-22: 600 mL via INTRAVENOUS

## 2021-12-21 MED ORDER — OXYTOCIN-SODIUM CHLORIDE 30-0.9 UT/500ML-% IV SOLN
2.5000 [IU]/h | INTRAVENOUS | Status: DC
Start: 2021-12-21 — End: 2021-12-22
  Filled 2021-12-21: qty 500

## 2021-12-21 MED ORDER — SOD CITRATE-CITRIC ACID 500-334 MG/5ML PO SOLN: 30.0000 mL | ORAL | Status: DC | PRN

## 2021-12-21 MED ORDER — FENTANYL CITRATE (PF) 100 MCG/2ML IJ SOLN
100.0000 ug | INTRAMUSCULAR | Status: DC | PRN
  Administered 2021-12-21: 100 ug via INTRAVENOUS

## 2021-12-21 MED ORDER — LACTATED RINGERS IV SOLN
INTRAVENOUS | Status: DC
Start: 2021-12-21 — End: 2021-12-22

## 2021-12-21 MED ORDER — FLEET ENEMA 7-19 GM/118ML RE ENEM: 1.0000 | ENEMA | RECTAL | Status: DC | PRN

## 2021-12-21 MED ORDER — LIDOCAINE HCL (PF) 1 % IJ SOLN: 30.0000 mL | INTRAMUSCULAR | Status: DC | PRN

## 2021-12-21 MED ORDER — FENTANYL CITRATE (PF) 100 MCG/2ML IJ SOLN
INTRAMUSCULAR | Status: AC
  Filled 2021-12-21: qty 2

## 2021-12-21 NOTE — MAU Note (Signed)
.  Becky Crawford is a 26 y.o. at [redacted]w[redacted]d here in MAU reporting: CTX every 2 mins since 1830. Pt states noticed some light VB possible bloody show after labor eval in MAU this am. Pt denies DFM, VB, LOF, recent intercourse, PIH s/s, and complications in the pregnancy.  GBS neg SVE 3/50 Onset of complaint: 1830 Pain score: 7/10 Vitals:   12/21/21 2039  BP: (!) 145/78  Pulse: (!) 112  Resp: 20  SpO2: 100%     FHT:150 Lab orders placed from triage:

## 2021-12-22 ENCOUNTER — Other Ambulatory Visit: Payer: Self-pay

## 2021-12-22 ENCOUNTER — Inpatient Hospital Stay (HOSPITAL_COMMUNITY): Payer: 59 | Admitting: Anesthesiology

## 2021-12-22 ENCOUNTER — Encounter (HOSPITAL_COMMUNITY): Payer: Self-pay | Admitting: Obstetrics and Gynecology

## 2021-12-22 LAB — RPR: RPR Ser Ql: NONREACTIVE

## 2021-12-22 MED ORDER — DIPHENHYDRAMINE HCL 50 MG/ML IJ SOLN: 12.5000 mg | INTRAMUSCULAR | Status: DC | PRN

## 2021-12-22 MED ORDER — FENTANYL-BUPIVACAINE-NACL 0.5-0.125-0.9 MG/250ML-% EP SOLN
12.0000 mL/h | EPIDURAL | Status: DC | PRN
  Administered 2021-12-22: 12 mL/h via EPIDURAL

## 2021-12-22 MED ORDER — ACETAMINOPHEN 325 MG PO TABS
650.0000 mg | ORAL_TABLET | ORAL | Status: DC | PRN
  Administered 2021-12-22 (×2): 650 mg via ORAL
  Filled 2021-12-22 (×2): qty 2

## 2021-12-22 MED ORDER — PHENYLEPHRINE 80 MCG/ML (10ML) SYRINGE FOR IV PUSH (FOR BLOOD PRESSURE SUPPORT): 80.0000 ug | PREFILLED_SYRINGE | INTRAVENOUS | Status: DC | PRN

## 2021-12-22 MED ORDER — ONDANSETRON HCL 4 MG/2ML IJ SOLN: 4.0000 mg | INTRAMUSCULAR | Status: DC | PRN

## 2021-12-22 MED ORDER — LIDOCAINE HCL (PF) 1 % IJ SOLN
INTRAMUSCULAR | Status: DC | PRN
  Administered 2021-12-22: 11 mL via EPIDURAL

## 2021-12-22 MED ORDER — OXYCODONE-ACETAMINOPHEN 5-325 MG PO TABS: 2.0000 | ORAL_TABLET | ORAL | Status: DC | PRN

## 2021-12-22 MED ORDER — IBUPROFEN 800 MG PO TABS
800.0000 mg | ORAL_TABLET | Freq: Three times a day (TID) | ORAL | Status: DC
  Administered 2021-12-22 – 2021-12-23 (×5): 800 mg via ORAL
  Filled 2021-12-22 (×5): qty 1

## 2021-12-22 MED ORDER — FENTANYL-BUPIVACAINE-NACL 0.5-0.125-0.9 MG/250ML-% EP SOLN
EPIDURAL | Status: AC
  Filled 2021-12-22: qty 250

## 2021-12-22 MED ORDER — EPHEDRINE 5 MG/ML INJ: 10.0000 mg | INTRAVENOUS | Status: DC | PRN

## 2021-12-22 MED ORDER — TETANUS-DIPHTH-ACELL PERTUSSIS 5-2.5-18.5 LF-MCG/0.5 IM SUSY: 0.5000 mL | PREFILLED_SYRINGE | Freq: Once | INTRAMUSCULAR | Status: DC

## 2021-12-22 MED ORDER — LACTATED RINGERS IV SOLN
500.0000 mL | Freq: Once | INTRAVENOUS | Status: AC
  Administered 2021-12-22: 500 mL via INTRAVENOUS

## 2021-12-22 MED ORDER — MEASLES, MUMPS & RUBELLA VAC IJ SOLR: 0.5000 mL | Freq: Once | INTRAMUSCULAR | Status: DC

## 2021-12-22 MED ORDER — ZOLPIDEM TARTRATE 5 MG PO TABS: 5.0000 mg | ORAL_TABLET | Freq: Every evening | ORAL | Status: DC | PRN

## 2021-12-22 MED ORDER — MAGNESIUM HYDROXIDE 400 MG/5ML PO SUSP: 30.0000 mL | ORAL | Status: DC | PRN

## 2021-12-22 MED ORDER — COCONUT OIL OIL: 1.0000 | TOPICAL_OIL | Status: DC | PRN

## 2021-12-22 MED ORDER — SIMETHICONE 80 MG PO CHEW: 80.0000 mg | CHEWABLE_TABLET | ORAL | Status: DC | PRN

## 2021-12-22 MED ORDER — DIBUCAINE (PERIANAL) 1 % EX OINT: 1.0000 | TOPICAL_OINTMENT | CUTANEOUS | Status: DC | PRN

## 2021-12-22 MED ORDER — SODIUM CHLORIDE 0.9% FLUSH: 3.0000 mL | INTRAVENOUS | Status: DC | PRN

## 2021-12-22 MED ORDER — ONDANSETRON HCL 4 MG PO TABS: 4.0000 mg | ORAL_TABLET | ORAL | Status: DC | PRN

## 2021-12-22 MED ORDER — EPHEDRINE 5 MG/ML INJ
10.0000 mg | INTRAVENOUS | Status: DC | PRN
  Filled 2021-12-22: qty 5

## 2021-12-22 MED ORDER — METHYLERGONOVINE MALEATE 0.2 MG/ML IJ SOLN: 0.2000 mg | INTRAMUSCULAR | Status: DC | PRN

## 2021-12-22 MED ORDER — SODIUM CHLORIDE 0.9 % IV SOLN: 250.0000 mL | INTRAVENOUS | Status: DC | PRN

## 2021-12-22 MED ORDER — SODIUM CHLORIDE 0.9% FLUSH: 3.0000 mL | Freq: Two times a day (BID) | INTRAVENOUS | Status: DC

## 2021-12-22 MED ORDER — FLUOXETINE HCL 20 MG PO CAPS
20.0000 mg | ORAL_CAPSULE | Freq: Every day | ORAL | Status: DC
  Administered 2021-12-22 – 2021-12-23 (×2): 20 mg via ORAL
  Filled 2021-12-22 (×2): qty 1

## 2021-12-22 MED ORDER — BENZOCAINE-MENTHOL 20-0.5 % EX AERO
1.0000 | INHALATION_SPRAY | CUTANEOUS | Status: DC | PRN
  Administered 2021-12-22: 1 via TOPICAL
  Filled 2021-12-22: qty 56

## 2021-12-22 MED ORDER — OLOPATADINE HCL 0.1 % OP SOLN
1.0000 [drp] | Freq: Two times a day (BID) | OPHTHALMIC | Status: DC
  Filled 2021-12-22: qty 5

## 2021-12-22 MED ORDER — EPINEPHRINE 0.3 MG/0.3ML IJ SOAJ
0.3000 mg | INTRAMUSCULAR | Status: DC | PRN
Start: 2021-12-22 — End: 2021-12-23

## 2021-12-22 MED ORDER — FERROUS SULFATE 325 (65 FE) MG PO TABS
325.0000 mg | ORAL_TABLET | Freq: Two times a day (BID) | ORAL | Status: DC
  Administered 2021-12-22 – 2021-12-23 (×2): 325 mg via ORAL
  Filled 2021-12-22 (×2): qty 1

## 2021-12-22 MED ORDER — PRENATAL MULTIVITAMIN CH
1.0000 | ORAL_TABLET | Freq: Every day | ORAL | Status: DC
  Administered 2021-12-22 – 2021-12-23 (×2): 1 via ORAL
  Filled 2021-12-22 (×2): qty 1

## 2021-12-22 MED ORDER — DIPHENHYDRAMINE HCL 25 MG PO CAPS: 25.0000 mg | ORAL_CAPSULE | Freq: Four times a day (QID) | ORAL | Status: DC | PRN

## 2021-12-22 MED ORDER — SENNOSIDES-DOCUSATE SODIUM 8.6-50 MG PO TABS
2.0000 | ORAL_TABLET | Freq: Every day | ORAL | Status: DC
  Administered 2021-12-23: 2 via ORAL
  Filled 2021-12-22: qty 2

## 2021-12-22 MED ORDER — WITCH HAZEL-GLYCERIN EX PADS
1.0000 | MEDICATED_PAD | CUTANEOUS | Status: DC | PRN
  Administered 2021-12-23: 1 via TOPICAL

## 2021-12-22 MED ORDER — ASPIRIN-ACETAMINOPHEN-CAFFEINE 250-250-65 MG PO TABS
1.0000 | ORAL_TABLET | Freq: Once | ORAL | Status: AC | PRN
Start: 2021-12-22 — End: 2021-12-22
  Administered 2021-12-22: 1 via ORAL
  Filled 2021-12-22: qty 1

## 2021-12-22 MED ORDER — METHYLERGONOVINE MALEATE 0.2 MG PO TABS: 0.2000 mg | ORAL_TABLET | ORAL | Status: DC | PRN

## 2021-12-22 NOTE — Lactation Note (Signed)
This note was copied from a baby's chart. Lactation Consultation Note  Patient Name: Becky Crawford [id number] Date: 12/22/2021 Reason for consult: Initial assessment;Mother's request;Difficult latch;Term;Breastfeeding assistance Age:27 hours  Birth parent needed to assess flange size. LC used Medela size 21 with some areola pull. Birth parent has spectra with inserts and will set up outpatient LC visit to reassess.   Currently using manual pump to pre pump before latching, size 21 comfortable for this.   Plan 1. To feed based on cues 8-12x 24hr period. Birth parent to latch and note signs of milk transfer.  2. If unable to latch, birth parent to hand express and offer colostrum at breast or spoon then try a latch.  All questions answered at the end of the visit.   Maternal Data Has patient been taught Hand Expression?: Yes Does the patient have breastfeeding experience prior to this delivery?: Yes How long did the patient breastfeed?: 8 months  Feeding Mother's Current Feeding Choice: Breast Milk  LATCH Score Latch: Repeated attempts needed to sustain latch, nipple held in mouth throughout feeding, stimulation needed to elicit sucking reflex.  Audible Swallowing: Spontaneous and intermittent  Type of Nipple: Flat  Comfort (Breast/Nipple): Soft / non-tender  Hold (Positioning): Assistance needed to correctly position infant at breast and maintain latch.  LATCH Score: 7   Lactation Tools Discussed/Used Tools: Pump;Flanges Flange Size: 21 Breast pump type: Manual Reason for Pumping: elongate nipple Pumping frequency: pre pump for 6x before latching to stretch the nipple.  Interventions Interventions: Breast feeding basics reviewed;Assisted with latch;Skin to skin;Breast massage;Hand express;Breast compression;Adjust position;Support pillows;Position options;Expressed milk;Hand pump;Education;LC Psychologist, educational;Infant Driven Feeding Algorithm education;Pre-pump if  needed  Discharge Pump: Manual WIC Program: No  Consult Status Consult Status: Follow-up Date: 12/16/21 Follow-up type: In-patient    Becky Crawford  Becky Crawford 12/22/2021, 12:36 PM

## 2021-12-22 NOTE — Lactation Note (Signed)
This note was copied from a baby's chart. Lactation Consultation Note  Patient Name: Girl Arayna Illescas [id number] Date: 12/22/2021 Reason for consult: L&D Initial assessment (2nd visit, Baby is now STS, on a oximeter monitor and to give the baby some more time. LD RN aware to call when baby is showing cues.) Age 27 mins old   Maternal Data    Feeding Mother's Current Feeding Choice: Breast Milk  LATCH Score                    Lactation Tools Discussed/Used    Interventions    Discharge    Consult Status Consult Status: Follow-up from L&D Date: 12/22/21 Follow-up type: In-patient    Matilde Sprang Sherion Dooly 12/22/2021, 8:38 AM

## 2021-12-22 NOTE — H&P (Signed)
26 y.o. [redacted]w[redacted]d  [id number] comes in c/o labor.  Otherwise has good fetal movement and no bleeding.  Past Medical History:  Diagnosis Date   Hx of migraine headaches    Palpitations    Urticaria     Past Surgical History:  Procedure Laterality Date   APPENDECTOMY      OB History  Gravida Para Term Preterm AB Living  4 1 1   2 1   SAB IAB Ectopic Multiple Live Births  2     0 1    # Outcome Date GA Lbr Len/2nd Weight Sex Delivery Anes PTL Lv  4 Current           3 Term 03/18/18 [redacted]w[redacted]d 20:17 / 00:34 3465 g F Vag-Spont EPI  LIV  2 SAB           1 SAB             Social History   Socioeconomic History   Marital status: Married    Spouse name: Dalton   Number of children: 2   Years of education: Not on file   Highest education level: Not on file  Occupational History   Not on file  Tobacco Use   Smoking status: Never   Smokeless tobacco: Never  Vaping Use   Vaping Use: Never used  Substance and Sexual Activity   Alcohol use: Not Currently   Drug use: Never   Sexual activity: Yes    Birth control/protection: None  Other Topics Concern   Not on file  Social History Narrative   Not on file   Social Determinants of Health   Financial Resource Strain: Not on file  Food Insecurity: Not on file  Transportation Needs: Not on file  Physical Activity: Not on file  Stress: Not on file  Social Connections: Not on file  Intimate Partner Violence: Not on file   Etonogestrel-ethinyl estradiol, Molds & smuts, Amoxicillin, and Penicillin g    Prenatal Transfer Tool  Maternal Diabetes: No Genetic Screening: Normal Maternal Ultrasounds/Referrals: Normal Fetal Ultrasounds or other Referrals:  None Maternal Substance Abuse:  No Significant Maternal Medications:  None Significant Maternal Lab Results: Group B Strep negative  Other PNC: uncomplicated.    Vitals:   12/22/21 0431 12/22/21 0501 12/22/21 0602 12/22/21 0632  BP: 105/75 (!) 104/46 128/75 115/68  Pulse:  89 95 97   Resp:  17 18 16   Temp:  99.3 F (37.4 C)    TempSrc:  Axillary    SpO2:   98%   Weight:      Height:        Lungs/Cor:  NAD Abdomen:  soft, gravid Ex:  no cords, erythema SVE:  now complete FHTs:  130s, good STV, NST R; Cat 1 tracing.  Occ mild variables. Toco:  q 2   A/P   Term labor.  GBS neg  02/21/22

## 2021-12-22 NOTE — Lactation Note (Signed)
This note was copied from a baby's chart. Lactation Consultation Note  Patient Name: Becky Crawford TGPQD'I Date: 12/22/2021 Reason for consult: L&D Initial assessment (LC L/D 30 minsPP, Baby on the warmer and being suctioned due to mec. Birthing parent and Support person aware of LC and F/U.) Age:27 mins PP  LD RN aware also LC will F/U.     Consult Status Consult Status: Follow-up from L&D Date: 12/22/21 Follow-up type: In-patient    Becky Crawford 12/22/2021, 8:14 AM

## 2021-12-22 NOTE — Anesthesia Preprocedure Evaluation (Signed)
Anesthesia Evaluation  Patient identified by MRN, date of birth, ID band Patient awake    Reviewed: Allergy & Precautions, H&P , NPO status , Patient's Chart, lab work & pertinent test results, reviewed documented beta blocker date and time   Airway Mallampati: II  TM Distance: >3 FB Neck ROM: full    Dental no notable dental hx.    Pulmonary neg pulmonary ROS,    Pulmonary exam normal breath sounds clear to auscultation       Cardiovascular negative cardio ROS Normal cardiovascular exam Rhythm:regular Rate:Normal     Neuro/Psych negative neurological ROS  negative psych ROS   GI/Hepatic negative GI ROS, Neg liver ROS,   Endo/Other  negative endocrine ROS  Renal/GU negative Renal ROS  negative genitourinary   Musculoskeletal   Abdominal (+) + obese,   Peds  Hematology negative hematology ROS (+)   Anesthesia Other Findings   Reproductive/Obstetrics (+) Pregnancy                             Anesthesia Physical  Anesthesia Plan  ASA: II  Anesthesia Plan: Epidural   Post-op Pain Management:    Induction:   PONV Risk Score and Plan:   Airway Management Planned:   Additional Equipment:   Intra-op Plan:   Post-operative Plan:   Informed Consent: I have reviewed the patients History and Physical, chart, labs and discussed the procedure including the risks, benefits and alternatives for the proposed anesthesia with the patient or authorized representative who has indicated his/her understanding and acceptance.       Plan Discussed with:   Anesthesia Plan Comments:         Anesthesia Quick Evaluation

## 2021-12-22 NOTE — Anesthesia Procedure Notes (Signed)
Epidural Patient location during procedure: OB Start time: 12/22/2021 12:30 AM End time: 12/22/2021 12:44 AM  Staffing Anesthesiologist: Lowella Curb, MD Performed: anesthesiologist   Preanesthetic Checklist Completed: patient identified, IV checked, site marked, risks and benefits discussed, surgical consent, monitors and equipment checked, pre-op evaluation and timeout performed  Epidural Patient position: sitting Prep: ChloraPrep Patient monitoring: heart rate, cardiac monitor, continuous pulse ox and blood pressure Approach: midline Location: L2-L3 Injection technique: LOR saline  Needle:  Needle type: Tuohy  Needle gauge: 17 G Needle length: 9 cm Needle insertion depth: 6 cm Catheter type: closed end flexible Catheter size: 20 Guage Catheter at skin depth: 10 cm Test dose: negative  Assessment Events: blood not aspirated, injection not painful, no injection resistance, no paresthesia and negative IV test  Additional Notes Reason for block:procedure for pain

## 2021-12-23 LAB — CBC
HCT: 31 % — ABNORMAL LOW (ref 36.0–46.0)
Hemoglobin: 10.3 g/dL — ABNORMAL LOW (ref 12.0–15.0)
MCH: 28.5 pg (ref 26.0–34.0)
MCHC: 33.2 g/dL (ref 30.0–36.0)
MCV: 85.6 fL (ref 80.0–100.0)
Platelets: 175 10*3/uL (ref 150–400)
RBC: 3.62 MIL/uL — ABNORMAL LOW (ref 3.87–5.11)
RDW: 14.6 % (ref 11.5–15.5)
WBC: 21.3 10*3/uL — ABNORMAL HIGH (ref 4.0–10.5)
nRBC: 0 % (ref 0.0–0.2)

## 2021-12-23 LAB — GC/CHLAMYDIA PROBE AMP (~~LOC~~) NOT AT ARMC
Chlamydia: NEGATIVE
Comment: NEGATIVE
Comment: NORMAL
Neisseria Gonorrhea: NEGATIVE

## 2021-12-23 MED ORDER — OXYCODONE HCL 5 MG PO TABS: 5.0000 mg | ORAL_TABLET | ORAL | 0 refills | Status: AC | PRN

## 2021-12-23 MED ORDER — IBUPROFEN 200 MG PO TABS: 600.0000 mg | ORAL_TABLET | Freq: Four times a day (QID) | ORAL | Status: AC | PRN

## 2021-12-23 MED ORDER — ACETAMINOPHEN 500 MG PO TABS: 1000.0000 mg | ORAL_TABLET | Freq: Four times a day (QID) | ORAL | 0 refills | Status: AC | PRN

## 2021-12-23 MED ORDER — OXYCODONE HCL 5 MG PO TABS
5.0000 mg | ORAL_TABLET | ORAL | Status: DC | PRN
  Administered 2021-12-23 (×2): 5 mg via ORAL
  Filled 2021-12-23 (×2): qty 1

## 2021-12-23 NOTE — Anesthesia Postprocedure Evaluation (Signed)
Anesthesia Post Note  Patient: Becky Crawford  Procedure(s) Performed: AN AD HOC LABOR EPIDURAL     Patient location during evaluation: Mother Baby Anesthesia Type: Epidural Level of consciousness: awake Pain management: satisfactory to patient Vital Signs Assessment: post-procedure vital signs reviewed and stable Respiratory status: spontaneous breathing Cardiovascular status: stable Anesthetic complications: no   No notable events documented.  Last Vitals:  Vitals:   12/22/21 2310 12/23/21 0543  BP: 117/72 115/73  Pulse: 78 67  Resp: 18 18  Temp: 36.8 C 36.7 C  SpO2: 99% 98%    Last Pain:  Vitals:   12/23/21 0810  TempSrc:   PainSc: 2    Pain Goal: Patients Stated Pain Goal: 2 (12/23/21 0810)                 Cephus Shelling

## 2021-12-23 NOTE — Progress Notes (Signed)
Post Partum Day 1 Subjective: Reports feeling sore around her perineal tear but relieved with pain meds. Ambulating, voiding, tolerating PO. Minimal lochia.   Objective: Patient Vitals for the past 24 hrs:  BP Temp Temp src Pulse Resp SpO2  12/23/21 0543 115/73 98.1 F (36.7 C) Oral 67 18 98 %  12/22/21 2310 117/72 98.2 F (36.8 C) Oral 78 18 99 %  12/22/21 2152 122/60 98.5 F (36.9 C) Oral 76 18 --  12/22/21 2048 137/79 97.7 F (36.5 C) Oral 98 18 98 %  12/22/21 1530 121/70 98.2 F (36.8 C) Oral 80 16 99 %    Physical Exam:  General: alert, cooperative, and no distress Lochia: appropriate Uterine Fundus: firm DVT Evaluation: No evidence of DVT seen on physical exam.  Recent Labs    12/21/21 2117 12/23/21 0442  WBC 18.0* 21.3*  HGB 13.4 10.3*  HCT 40.7 31.0*  PLT 237 175    Recent Labs    12/21/21 2117  NA 134*  K 3.5  CL 106  BUN 7  CREATININE 0.56  GLUCOSE 95  BILITOT 1.2  ALT 15  AST 18  ALKPHOS 112  PROT 6.4*  ALBUMIN 2.7*    Recent Labs    12/21/21 2117  CALCIUM 9.5    No results for input(s): "PROTIME", "APTT", "INR" in the last 72 hours.  No results for input(s): "PROTIME", "APTT", "INR", "FIBRINOGEN" in the last 72 hours. Assessment/Plan:  Becky Crawford 25 y.o. [id number] PPD#1 sp SVD 1. PPC: routine PP care 2. Rh pos 3. Dispo: discharge later today vs. Tomorrow pending pain control and infant dispo   LOS: 2 days   [name] 12/23/2021, 11:51 AM

## 2021-12-23 NOTE — Progress Notes (Signed)
Becky Crawford was referred for history of depression/anxiety. * Referral screened out by Clinical Social Worker because none of the following criteria appear to apply: ~ History of anxiety/depression during this pregnancy, or of post-partum depression. ~ Diagnosis of anxiety and/or depression within last 3 years OR * Becky Crawford's symptoms currently being treated with medication and/or therapy. Becky Crawford has an active Rx for Prozac.   Please contact the Clinical Social Worker if needs arise, or if Becky Crawford requests.  Edinburgh Score is 4.  Blaine Hamper, MSW, LCSW Clinical Social Work [phone]

## 2021-12-23 NOTE — Discharge Summary (Signed)
Postpartum Discharge Summary  Date of Service updated 12/23/21      Patient Name: Becky Crawford DOB: [birth year] MRN: [id number]  Date of admission: 12/21/2021 Delivery date:12/22/2021  Delivering provider: Bobbye Charleston  Date of discharge: 12/23/2021  Admitting diagnosis: Normal labor [O80, Z37.9] Intrauterine pregnancy: [redacted]w[redacted]d    Secondary diagnosis:  Principal Problem:   Normal labor  Additional problems: none    Discharge diagnosis: Term Pregnancy Delivered                                              Post partum procedures: none Augmentation: N/A Complications: None  Hospital course: Onset of Labor With Vaginal Delivery      26y.o. yo [id number] 445w2das admitted in Active Labor on 12/21/2021. Patient had an uncomplicated labor course as follows:  Membrane Rupture Time/Date: 3:48 AM ,12/22/2021   Delivery Method:Vaginal, Spontaneous  Episiotomy: None  Lacerations:  2nd degree  Patient had an uncomplicated postpartum course.  She is ambulating, tolerating a regular diet, passing flatus, and urinating well. Patient is discharged home in stable condition on 12/23/21.  Newborn Data: Birth date:12/22/2021  Birth time:7:42 AM  Gender:Female  Living status:Living  Apgars:8 ,9  Weight:3930 g   Magnesium Sulfate received: No BMZ received: No Rhophylac:N/A MMR:N/A T-DaP:Given prenatally Flu: No Transfusion:No  Physical exam  Vitals:   12/22/21 2152 12/22/21 2310 12/23/21 0543 12/23/21 1501  BP: 122/60 117/72 115/73 116/74  Pulse: 76 78 67 75  Resp: '18 18 18 19  ' Temp: 98.5 F (36.9 C) 98.2 F (36.8 C) 98.1 F (36.7 C) 98.2 F (36.8 C)  TempSrc: Oral Oral Oral Oral  SpO2:  99% 98%   Weight:      Height:       General: alert, cooperative, and no distress Lochia: appropriate Uterine Fundus: firm Incision: N/A DVT Evaluation: No evidence of DVT seen on physical exam. Labs: Lab Results  Component Value Date   WBC 21.3 (H) 12/23/2021   HGB 10.3 (L)  12/23/2021   HCT 31.0 (L) 12/23/2021   MCV 85.6 12/23/2021   PLT 175 12/23/2021      Latest Ref Rng & Units 12/21/2021    9:17 PM  CMP  Glucose 70 - 99 mg/dL 95   BUN 6 - 20 mg/dL 7   Creatinine 0.44 - 1.00 mg/dL 0.56   Sodium 135 - 145 mmol/L 134   Potassium 3.5 - 5.1 mmol/L 3.5   Chloride 98 - 111 mmol/L 106   CO2 22 - 32 mmol/L 20   Calcium 8.9 - 10.3 mg/dL 9.5   Total Protein 6.5 - 8.1 g/dL 6.4   Total Bilirubin 0.3 - 1.2 mg/dL 1.2   Alkaline Phos 38 - 126 U/L 112   AST 15 - 41 U/L 18   ALT 0 - 44 U/L 15    Edinburgh Score:    12/22/2021    5:54 PM  Edinburgh Postnatal Depression Scale Screening Tool  I have been able to laugh and see the funny side of things. 0  I have looked forward with enjoyment to things. 0  I have blamed myself unnecessarily when things went wrong. 1  I have been anxious or worried for no good reason. 1  I have felt scared or panicky for no good reason. 1  Things have been getting on top of  me. 1  I have been so unhappy that I have had difficulty sleeping. 0  I have felt sad or miserable. 0  I have been so unhappy that I have been crying. 0  The thought of harming myself has occurred to me. 0  Edinburgh Postnatal Depression Scale Total 4      After visit meds:  Allergies as of 12/23/2021       Reactions   Etonogestrel-ethinyl Estradiol Itching, Other (See Comments)   Yeast infections   Molds & Smuts Hives, Swelling   Swelling of eyes and lips   Other Hives, Itching   Dust Mites   Amoxicillin Rash   High fever, reaction as a child Has patient had a PCN reaction causing immediate rash, facial/tongue/throat swelling, SOB or lightheadedness with hypotension: No Has patient had a PCN reaction causing severe rash involving mucus membranes or skin necrosis: No Has patient had a PCN reaction that required hospitalization: No Has patient had a PCN reaction occurring within the last 10 years: No If all of the above answers are "NO", then may  proceed with Cephalosporin use.   Penicillin G Rash        Medication List     TAKE these medications    acetaminophen 500 MG tablet Commonly known as: TYLENOL Take 2 tablets (1,000 mg total) by mouth every 6 (six) hours as needed for mild pain. What changed:  when to take this reasons to take this   EPINEPHrine 0.3 mg/0.3 mL Soaj injection Commonly known as: EPI-PEN Inject 0.3 mg into the muscle as needed for anaphylaxis.   FLUoxetine 20 MG capsule Commonly known as: PROZAC Take 20 mg by mouth daily.   ibuprofen 200 MG tablet Commonly known as: ADVIL Take 3 tablets (600 mg total) by mouth every 6 (six) hours as needed.   Olopatadine HCl 0.2 % Soln Commonly known as: Pataday Instill 1 drop each eye once daily if needed for itchy eyes   oxyCODONE 5 MG immediate release tablet Commonly known as: Oxy IR/ROXICODONE Take 1 tablet (5 mg total) by mouth every 4 (four) hours as needed for moderate pain or severe pain.   prenatal multivitamin Tabs tablet Take 1 tablet by mouth daily at 12 noon.         Discharge home in stable condition Infant Feeding: Breast Infant Disposition:home with mother Discharge instruction: per After Visit Summary and Postpartum booklet. Activity: Advance as tolerated. Pelvic rest for 6 weeks.  Diet: routine diet Anticipated Birth Control: Unsure Postpartum Appointment:4 weeks Additional Postpartum F/U:  none Future Appointments: Future Appointments  Date Time Provider [place]  12/27/2021 12:00 AM [facility] [place] None   Follow up Visit:  Follow-up Information     Bobbye Charleston, MD Follow up in 4 week(s).   Specialty: Obstetrics and Gynecology Contact information: [address] [place] [address] [phone]                     12/23/2021 Rowland Lathe, MD

## 2021-12-23 NOTE — Lactation Note (Signed)
This note was copied from a baby's chart. Lactation Consultation Note  Patient Name: Becky Crawford ASTMH'D Date: 12/23/2021 Reason for consult: Follow-up assessment;Term Age:27 hours  P2: Term baby at 40+2 weeks Feeding preference: Breast  Baby was swaddled and asleep when I arrived.  Birth parent had no questions/concerns related to breast feeding.  Last LATCH score was an 8; voiding/stooling.  Family has our op phone number for any questions after discharge.  Birth parent stated that this baby is latching much better than her first.  Encouragement given.  Support person present.   Maternal Data    Feeding Mother's Current Feeding Choice: Breast Milk  LATCH Score                    Lactation Tools Discussed/Used    Interventions Interventions: Education  Discharge Discharge Education: Engorgement and breast care Pump: Personal  Consult Status Consult Status: Complete Date: 12/23/21 Follow-up type: Call as needed    Charnell Peplinski R Zeenat Jeanbaptiste 12/23/2021, 2:14 PM

## 2021-12-23 NOTE — Progress Notes (Signed)
Pt c/o perineum pain at stitches with movement and having a hard time getting comfortable in bed. (Pt has been sitting on bottom quite a bit breastfeeding baby.) Had motrin at 1950, tylenol at 2314. Only other pain medication is percocet. Dr. Henderson Cloud called and orders received.

## 2021-12-27 ENCOUNTER — Inpatient Hospital Stay (HOSPITAL_COMMUNITY): Admission: AD | Admit: 2021-12-27 | Payer: 59 | Source: Home / Self Care | Admitting: Obstetrics and Gynecology

## 2021-12-27 ENCOUNTER — Encounter (HOSPITAL_COMMUNITY): Payer: Self-pay | Admitting: Obstetrics and Gynecology

## 2021-12-27 ENCOUNTER — Inpatient Hospital Stay (HOSPITAL_COMMUNITY): Payer: 59

## 2021-12-27 ENCOUNTER — Inpatient Hospital Stay (HOSPITAL_COMMUNITY)
Admission: AD | Admit: 2021-12-27 | Discharge: 2021-12-27 | Disposition: A | Discharge status: Home / Self Care | Payer: 59 | Attending: Obstetrics and Gynecology | Admitting: Obstetrics and Gynecology

## 2021-12-27 DIAGNOSIS — O165 Unspecified maternal hypertension, complicating the puerperium: Secondary | ICD-10-CM | POA: Insufficient documentation

## 2021-12-27 DIAGNOSIS — R519 Headache, unspecified: Secondary | ICD-10-CM | POA: Insufficient documentation

## 2021-12-27 DIAGNOSIS — O9089 Other complications of the puerperium, not elsewhere classified: Secondary | ICD-10-CM | POA: Insufficient documentation

## 2021-12-27 LAB — CBC WITH DIFFERENTIAL/PLATELET
Abs Immature Granulocytes: 0.22 10*3/uL — ABNORMAL HIGH (ref 0.00–0.07)
Basophils Absolute: 0.1 10*3/uL (ref 0.0–0.1)
Basophils Relative: 0 %
Eosinophils Absolute: 0.4 10*3/uL (ref 0.0–0.5)
Eosinophils Relative: 3 %
HCT: 33.7 % — ABNORMAL LOW (ref 36.0–46.0)
Hemoglobin: 11 g/dL — ABNORMAL LOW (ref 12.0–15.0)
Immature Granulocytes: 2 %
Lymphocytes Relative: 20 %
Lymphs Abs: 2.7 10*3/uL (ref 0.7–4.0)
MCH: 28.2 pg (ref 26.0–34.0)
MCHC: 32.6 g/dL (ref 30.0–36.0)
MCV: 86.4 fL (ref 80.0–100.0)
Monocytes Absolute: 0.5 10*3/uL (ref 0.1–1.0)
Monocytes Relative: 4 %
Neutro Abs: 9.9 10*3/uL — ABNORMAL HIGH (ref 1.7–7.7)
Neutrophils Relative %: 71 %
Platelets: 239 10*3/uL (ref 150–400)
RBC: 3.9 MIL/uL (ref 3.87–5.11)
RDW: 14.3 % (ref 11.5–15.5)
WBC: 13.7 10*3/uL — ABNORMAL HIGH (ref 4.0–10.5)
nRBC: 0 % (ref 0.0–0.2)

## 2021-12-27 LAB — COMPREHENSIVE METABOLIC PANEL
ALT: 22 U/L (ref 0–44)
AST: 20 U/L (ref 15–41)
Albumin: 2.5 g/dL — ABNORMAL LOW (ref 3.5–5.0)
Alkaline Phosphatase: 73 U/L (ref 38–126)
Anion gap: 7 (ref 5–15)
BUN: 8 mg/dL (ref 6–20)
CO2: 25 mmol/L (ref 22–32)
Calcium: 8.6 mg/dL — ABNORMAL LOW (ref 8.9–10.3)
Chloride: 109 mmol/L (ref 98–111)
Creatinine, Ser: 0.68 mg/dL (ref 0.44–1.00)
GFR, Estimated: >60 mL/min (ref >60)
Glucose, Bld: 121 mg/dL — ABNORMAL HIGH (ref 70–99)
Potassium: 3.8 mmol/L (ref 3.5–5.1)
Sodium: 141 mmol/L (ref 135–145)
Total Bilirubin: 0.5 mg/dL (ref 0.3–1.2)
Total Protein: 5.8 g/dL — ABNORMAL LOW (ref 6.5–8.1)

## 2021-12-27 MED ORDER — CYCLOBENZAPRINE HCL 5 MG PO TABS
10.0000 mg | ORAL_TABLET | Freq: Once | ORAL | Status: AC
  Administered 2021-12-27: 10 mg via ORAL
  Filled 2021-12-27: qty 2

## 2021-12-27 MED ORDER — METOCLOPRAMIDE HCL 10 MG PO TABS
10.0000 mg | ORAL_TABLET | Freq: Once | ORAL | Status: AC
  Administered 2021-12-27: 10 mg via ORAL
  Filled 2021-12-27: qty 1

## 2021-12-27 MED ORDER — NIFEDIPINE ER OSMOTIC RELEASE 30 MG PO TB24: 30.0000 mg | ORAL_TABLET | Freq: Every day | ORAL | 1 refills | Status: AC

## 2021-12-27 MED ORDER — ACETAMINOPHEN-CAFFEINE 500-65 MG PO TABS
1.0000 | ORAL_TABLET | Freq: Once | ORAL | Status: AC
  Administered 2021-12-27: 1 via ORAL
  Filled 2021-12-27: qty 1

## 2021-12-27 NOTE — MAU Note (Signed)
Becky Crawford is a 26 y.o. at Unknown here in MAU reporting: persistent HA, took something for it around 0900, min relief. hands and feet are swollen, face feels puffy Vag delivery 8/6. Had a few BPs that were high- 'chocked it up to pain', everything was fine on d/c. Onset of complaint: yesterday Pain score: 3-4 Vitals:   12/27/21 1222  BP: (!) 144/80  Pulse: 82  Resp: 18  Temp: 98.3 F (36.8 C)  SpO2: 98%     Lab orders placed from triage:

## 2021-12-27 NOTE — MAU Provider Note (Signed)
History     CSN: [id number]  Arrival date and time: 12/27/21 1213   Event Date/Time   First Provider Initiated Contact with Patient 12/27/21 1239      Chief Complaint  Patient presents with   Headache   Foot Swelling   Becky Crawford , a  26 y.o. [id number] at 5 days postpartum presents to MAU from the office with complaints of Persistent Headache and pressure behind the eyes for 2 days. Patient states she woke up yesterday with a headache and her face, eyes and hands were puffy. Her BP at home yesterday was 140/90. She had a PP office visit today and BP were high 130's /90's with same headache and was recommended to present to MAU. At 0940 she attemped 500mg  Tylenol and 600mg  without improvement. Currently rates pain 7/10. Denies pedal swelling. Denies blurred vision, spots and floaters. Denies right sided epigastric pain. States she"overall does not feel well." Headache unaffected by light and sound. No other complaints.          OB History     Gravida  4   Para  2   Term  2   Preterm      AB  2   Living  2      SAB  2   IAB      Ectopic      Multiple  0   Live Births  2           Past Medical History:  Diagnosis Date   Hx of migraine headaches    Palpitations    Urticaria     Past Surgical History:  Procedure Laterality Date   APPENDECTOMY      Family History  Problem Relation Age of Onset   Hypertension Mother    Diabetes Mother    Hypertension Father    Diabetes Father    Food Allergy Father    Liver cancer Father    Hepatitis C Father    Pulmonary embolism Paternal Aunt    Pulmonary embolism Paternal Grandmother     Social History   Tobacco Use   Smoking status: Never   Smokeless tobacco: Never  Vaping Use   Vaping Use: Never used  Substance Use Topics   Alcohol use: Not Currently   Drug use: Never    Allergies:  Allergies  Allergen Reactions   Etonogestrel-Ethinyl Estradiol Itching and Other (See Comments)    Yeast  infections   Molds & Smuts Hives and Swelling    Swelling of eyes and lips   Other Hives and Itching    Dust Mites   Amoxicillin Rash    High fever, reaction as a child Has patient had a PCN reaction causing immediate rash, facial/tongue/throat swelling, SOB or lightheadedness with hypotension: No Has patient had a PCN reaction causing severe rash involving mucus membranes or skin necrosis: No Has patient had a PCN reaction that required hospitalization: No Has patient had a PCN reaction occurring within the last 10 years: No If all of the above answers are "NO", then may proceed with Cephalosporin use.    Penicillin G Rash    No medications prior to admission.    Review of Systems  Constitutional:  Positive for fatigue. Negative for chills and fever.  Eyes:  Positive for pain. Negative for photophobia and visual disturbance.  Respiratory:  Negative for apnea, shortness of breath and wheezing.   Cardiovascular:  Negative for chest pain, palpitations and leg swelling.  Gastrointestinal:  Negative for nausea and vomiting.  Genitourinary:  Negative for difficulty urinating and flank pain.  Neurological:  Positive for headaches. Negative for dizziness, syncope, weakness and light-headedness.  Psychiatric/Behavioral:  Negative for suicidal ideas.    Physical Exam   Blood pressure 123/66, pulse 67, temperature 98.2 F (36.8 C), temperature source Oral, resp. rate 18, SpO2 97 %, currently breastfeeding.  Physical Exam Vitals and nursing note reviewed.  Constitutional:      General: She is not in acute distress.    Appearance: Normal appearance. She is well-developed. She is not ill-appearing.  HENT:     Head: Normocephalic. No right periorbital erythema or left periorbital erythema.  Cardiovascular:     Rate and Rhythm: Normal rate and regular rhythm.  Pulmonary:     Effort: Pulmonary effort is normal.  Musculoskeletal:        General: No swelling.     Cervical back: Normal  range of motion.     Comments: Very mild periorbital edema, patient states it was worse yesterday.   No hyperreflexia   Skin:    General: Skin is warm and dry.  Neurological:     Mental Status: She is alert and oriented to person, place, and time.     GCS: GCS eye subscore is 4. GCS verbal subscore is 5. GCS motor subscore is 6.     Motor: No weakness.     Gait: Gait normal.     Deep Tendon Reflexes: Reflexes normal.  Psychiatric:        Mood and Affect: Mood normal.    Patient Vitals for the past 24 hrs:  BP Temp Temp src Pulse Resp SpO2  12/27/21 1606 123/66 98.2 F (36.8 C) Oral 67 18 97 %  12/27/21 1401 135/75 -- -- 78 -- 97 %  12/27/21 1345 131/81 -- -- 86 -- 97 %  12/27/21 1330 134/71 -- -- 82 -- 97 %  12/27/21 1315 128/73 -- -- 79 -- 96 %  12/27/21 1300 (!) 142/64 -- -- 86 -- --  12/27/21 1248 137/86 -- -- 92 -- --  12/27/21 1236 137/76 -- -- (!) 103 -- 98 %  12/27/21 1222 (!) 144/80 98.3 F (36.8 C) Oral 82 18 98 %    MAU Course  Procedures Orders Placed This Encounter  Procedures   CBC with Differential/Platelet   Comprehensive metabolic panel   Discharge patient   Meds ordered this encounter  Medications   cyclobenzaprine (FLEXERIL) tablet 10 mg   acetaminophen-caffeine (EXCEDRIN TENSION HEADACHE) 500-65 MG per tablet 1 tablet   metoCLOPramide (REGLAN) tablet 10 mg   NIFEdipine (PROCARDIA XL) 30 MG 24 hr tablet    Sig: Take 1 tablet (30 mg total) by mouth daily.    Dispense:  30 tablet    Refill:  1    Order Specific Question:   Supervising Provider    Answer:   Reva Bores [[id number]]   Results for orders placed or performed during the hospital encounter of 12/27/21 (from the past 24 hour(s))  CBC with Differential/Platelet     Status: Abnormal   Collection Time: 12/27/21  1:00 PM  Result Value Ref Range   WBC 13.7 (H) 4.0 - 10.5 K/uL   RBC 3.90 3.87 - 5.11 MIL/uL   Hemoglobin 11.0 (L) 12.0 - 15.0 g/dL   HCT 62.5 (L) 63.8 - 93.7 %   MCV 86.4 80.0 -  100.0 fL   MCH 28.2 26.0 - 34.0 pg   MCHC 32.6 30.0 -  36.0 g/dL   RDW 90.3 00.9 - 23.3 %   Platelets 239 150 - 400 K/uL   nRBC 0.0 0.0 - 0.2 %   Neutrophils Relative % 71 %   Neutro Abs 9.9 (H) 1.7 - 7.7 K/uL   Lymphocytes Relative 20 %   Lymphs Abs 2.7 0.7 - 4.0 K/uL   Monocytes Relative 4 %   Monocytes Absolute 0.5 0.1 - 1.0 K/uL   Eosinophils Relative 3 %   Eosinophils Absolute 0.4 0.0 - 0.5 K/uL   Basophils Relative 0 %   Basophils Absolute 0.1 0.0 - 0.1 K/uL   Immature Granulocytes 2 %   Abs Immature Granulocytes 0.22 (H) 0.00 - 0.07 K/uL  Comprehensive metabolic panel     Status: Abnormal   Collection Time: 12/27/21  1:00 PM  Result Value Ref Range   Sodium 141 135 - 145 mmol/L   Potassium 3.8 3.5 - 5.1 mmol/L   Chloride 109 98 - 111 mmol/L   CO2 25 22 - 32 mmol/L   Glucose, Bld 121 (H) 70 - 99 mg/dL   BUN 8 6 - 20 mg/dL   Creatinine, Ser 0.07 0.44 - 1.00 mg/dL   Calcium 8.6 (L) 8.9 - 10.3 mg/dL   Total Protein 5.8 (L) 6.5 - 8.1 g/dL   Albumin 2.5 (L) 3.5 - 5.0 g/dL   AST 20 15 - 41 U/L   ALT 22 0 - 44 U/L   Alkaline Phosphatase 73 38 - 126 U/L   Total Bilirubin 0.5 0.3 - 1.2 mg/dL   GFR, Estimated >62 >26 mL/min   Anion gap 7 5 - 15     MDM Labs reviewed and interpreted by me.   - BP's remain elevated in MAU. No severe range.  - Headache unrelieved with PO Flexeril. PO Excedrin and Reglan ordered.  - Postpartum PreE labs normal.  - Headache improved with PO Excedrin and Reglan.  - Notified on-call MD K. Ross of patient presentation and lab results. Based on Normal Labs with mildly elevated to high normal BPs, recommendation to start PO Daily 30mg  Procardia XL for Hypertension. MD agrees with plan of care and will notify patient for BP follow-up in the office for early next week. - Low suspicion for PreE.  - Plan to discharge home.    Assessment and Plan   1. Postpartum hypertension   2. Postpartum care and examination   3. Postpartum headache    -  Discussed elevated BP in postpartum period. - Recommended PO Procardia XL for hypertension. Rx sent to outpatient pharmacy. Encouraged to follow-up with office for BP check and continued management.  - Also reviewed normal PP changes and normalcy of fatigue and the importance of staying hydrated and eating frequently.   - Discussed worsening signs and symptoms when to return to MAU.  -  Pre E precautions reviewed.  - Patient discharged home in stable condition and may return to MAU as needed.   , MSN, CNM  12/27/2021, 4:19 PM

## 2021-12-31 ENCOUNTER — Telehealth (HOSPITAL_COMMUNITY): Payer: Self-pay

## 2021-12-31 NOTE — Telephone Encounter (Signed)
Patient reports feeling good. "I followed up with my OB-GYN yesterday for a BP check." Patient declines questions/concerns about her health and healing.  Patient reports that baby is doing well. Eating, peeing/pooping, and gaining weight well. Baby sleeps in a bassinet. RN reviewed ABC's of safe sleep with patient. Patient declines any questions or concerns about baby.  EPDS score is 4.  Marcelino Duster [facility]  12/31/21,1633
